# Patient Record
Sex: Male | Born: 1965 | Race: Black or African American | Hispanic: No | Marital: Single | Smoking: Current every day smoker
Health system: Southern US, Community
[De-identification: ages and names within clinical notes are randomized; demographics above are authoritative.]

## PROBLEM LIST (undated history)

## (undated) HISTORY — PX: OTHER SURGICAL HISTORY: SHX169

---

## 1999-10-10 ENCOUNTER — Emergency Department (HOSPITAL_COMMUNITY): Admission: EM | Admit: 1999-10-10 | Discharge: 1999-10-10 | Payer: Self-pay | Admitting: *Deleted

## 2000-10-07 ENCOUNTER — Emergency Department (HOSPITAL_COMMUNITY): Admission: EM | Admit: 2000-10-07 | Discharge: 2000-10-07 | Payer: Self-pay | Admitting: Emergency Medicine

## 2004-04-05 ENCOUNTER — Emergency Department (HOSPITAL_COMMUNITY): Admission: EM | Admit: 2004-04-05 | Discharge: 2004-04-06 | Payer: Self-pay | Admitting: Emergency Medicine

## 2004-04-15 ENCOUNTER — Emergency Department (HOSPITAL_COMMUNITY): Admission: EM | Admit: 2004-04-15 | Discharge: 2004-04-15 | Payer: Self-pay | Admitting: Emergency Medicine

## 2004-04-21 ENCOUNTER — Emergency Department (HOSPITAL_COMMUNITY): Admission: EM | Admit: 2004-04-21 | Discharge: 2004-04-21 | Payer: Self-pay | Admitting: Emergency Medicine

## 2004-05-23 ENCOUNTER — Emergency Department (HOSPITAL_COMMUNITY): Admission: EM | Admit: 2004-05-23 | Discharge: 2004-05-23 | Payer: Self-pay | Admitting: Emergency Medicine

## 2004-09-08 ENCOUNTER — Emergency Department (HOSPITAL_COMMUNITY): Admission: EM | Admit: 2004-09-08 | Discharge: 2004-09-09 | Payer: Self-pay | Admitting: Emergency Medicine

## 2004-09-14 ENCOUNTER — Emergency Department (HOSPITAL_COMMUNITY): Admission: EM | Admit: 2004-09-14 | Discharge: 2004-09-14 | Payer: Self-pay | Admitting: Emergency Medicine

## 2004-09-19 ENCOUNTER — Emergency Department (HOSPITAL_COMMUNITY): Admission: EM | Admit: 2004-09-19 | Discharge: 2004-09-19 | Payer: Self-pay | Admitting: Emergency Medicine

## 2008-04-06 ENCOUNTER — Emergency Department (HOSPITAL_COMMUNITY): Admission: EM | Admit: 2008-04-06 | Discharge: 2008-04-06 | Payer: Self-pay | Admitting: Emergency Medicine

## 2011-04-09 ENCOUNTER — Emergency Department (HOSPITAL_COMMUNITY): Payer: Self-pay

## 2011-04-09 ENCOUNTER — Emergency Department (HOSPITAL_COMMUNITY)
Admission: EM | Admit: 2011-04-09 | Discharge: 2011-04-10 | Disposition: A | Discharge status: Home / Self Care | Payer: Self-pay | Attending: Emergency Medicine | Admitting: Emergency Medicine

## 2011-04-09 DIAGNOSIS — R0789 Other chest pain: Secondary | ICD-10-CM | POA: Insufficient documentation

## 2011-04-09 DIAGNOSIS — R209 Unspecified disturbances of skin sensation: Secondary | ICD-10-CM | POA: Insufficient documentation

## 2011-04-09 DIAGNOSIS — I498 Other specified cardiac arrhythmias: Secondary | ICD-10-CM | POA: Insufficient documentation

## 2011-04-09 DIAGNOSIS — M79609 Pain in unspecified limb: Secondary | ICD-10-CM | POA: Insufficient documentation

## 2011-04-09 LAB — CBC
HCT: 48.9 % (ref 39.0–52.0)
Hemoglobin: 17.2 g/dL — ABNORMAL HIGH (ref 13.0–17.0)
MCH: 31.7 pg (ref 26.0–34.0)
MCHC: 35.2 g/dL (ref 30.0–36.0)
MCV: 90.2 fL (ref 78.0–100.0)
Platelets: 150 10*3/uL (ref 150–400)
RBC: 5.42 MIL/uL (ref 4.22–5.81)
RDW: 13.3 % (ref 11.5–15.5)
WBC: 6.2 10*3/uL (ref 4.0–10.5)

## 2011-04-09 LAB — BASIC METABOLIC PANEL
BUN: 18 mg/dL (ref 6–23)
CO2: 31 mEq/L (ref 19–32)
Calcium: 9.8 mg/dL (ref 8.4–10.5)
Chloride: 100 mEq/L (ref 96–112)
Creatinine, Ser: 1.17 mg/dL (ref 0.4–1.5)
GFR calc Af Amer: >60 mL/min (ref >60)
GFR calc non Af Amer: >60 mL/min (ref >60)
Glucose, Bld: 95 mg/dL (ref 70–99)
Potassium: 4.3 mEq/L (ref 3.5–5.1)
Sodium: 139 mEq/L (ref 135–145)

## 2011-04-09 LAB — DIFFERENTIAL
Basophils Absolute: 0 10*3/uL (ref 0.0–0.1)
Basophils Relative: 0 % (ref 0–1)
Eosinophils Absolute: 0.3 10*3/uL (ref 0.0–0.7)
Eosinophils Relative: 4 % (ref 0–5)
Lymphocytes Relative: 25 % (ref 12–46)
Lymphs Abs: 1.6 10*3/uL (ref 0.7–4.0)
Monocytes Absolute: 0.5 10*3/uL (ref 0.1–1.0)
Monocytes Relative: 9 % (ref 3–12)
Neutro Abs: 3.8 10*3/uL (ref 1.7–7.7)
Neutrophils Relative %: 62 % (ref 43–77)

## 2011-04-09 LAB — POCT CARDIAC MARKERS
CKMB, poc: <1 ng/mL — ABNORMAL LOW (ref 1.0–8.0)
Myoglobin, poc: 41.4 ng/mL (ref 12–200)
Troponin i, poc: <0.05 ng/mL (ref 0.00–0.09)

## 2012-09-22 ENCOUNTER — Emergency Department (HOSPITAL_COMMUNITY)
Admission: EM | Admit: 2012-09-22 | Discharge: 2012-09-23 | Disposition: A | Discharge status: Home / Self Care | Payer: Self-pay | Attending: Emergency Medicine | Admitting: Emergency Medicine

## 2012-09-22 ENCOUNTER — Encounter (HOSPITAL_COMMUNITY): Payer: Self-pay | Admitting: Emergency Medicine

## 2012-09-22 DIAGNOSIS — F17200 Nicotine dependence, unspecified, uncomplicated: Secondary | ICD-10-CM | POA: Insufficient documentation

## 2012-09-22 DIAGNOSIS — L03319 Cellulitis of trunk, unspecified: Secondary | ICD-10-CM | POA: Insufficient documentation

## 2012-09-22 DIAGNOSIS — L0291 Cutaneous abscess, unspecified: Secondary | ICD-10-CM

## 2012-09-22 DIAGNOSIS — L02219 Cutaneous abscess of trunk, unspecified: Secondary | ICD-10-CM | POA: Insufficient documentation

## 2012-09-22 NOTE — ED Notes (Signed)
Pt with abscess to chest x 1 month

## 2012-09-22 NOTE — ED Notes (Signed)
Called patient 3 times and NO ANSWER. 

## 2012-09-22 NOTE — ED Provider Notes (Signed)
History   This chart was scribed for Douglas Munch, MD by Gerlean Ren. This patient was seen in room TR09C/TR09C and the patient's care was started at 10:05 PM .   CSN: [id number]  Arrival date & time 09/22/12  1829   None     Chief Complaint  Patient presents with  . Abscess    (Consider location/radiation/quality/duration/timing/severity/associated sxs/prior treatment) The history is provided by the patient. No language interpreter was used.   Douglas Schroeder is a 46 y.o. male who presents to the Emergency Department complaining of an abscess over sternal chest for past 2 months that has been getting larger over past 2 weeks.  Pt denies associated confusion, dizziness, and fever.  History reviewed. No pertinent past medical history.  History reviewed. No pertinent past surgical history.  History reviewed. No pertinent family history.  History  Substance Use Topics  . Smoking status: Current Every Day Smoker  . Smokeless tobacco: Not on file  . Alcohol Use: Yes      Review of Systems  Constitutional:       Per HPI, otherwise negative  HENT:       Per HPI, otherwise negative  Eyes: Negative.   Respiratory:       Per HPI, otherwise negative  Cardiovascular:       Per HPI, otherwise negative  Gastrointestinal: Negative for vomiting.  Genitourinary: Negative.   Musculoskeletal:       Per HPI, otherwise negative  Skin: Negative.        Abscess.  Neurological: Negative for syncope.    Allergies  Citric acid  Home Medications   Current Outpatient Rx  Name  Route  Sig  Dispense  Refill  . IBUPROFEN 200 MG PO TABS   Oral   Take 400 mg by mouth every 6 (six) hours as needed. For pain           BP 131/74  Pulse 69  Temp 98.8 F (37.1 C) (Oral)  Resp 18  SpO2 99%  Physical Exam  Nursing note and vitals reviewed. Constitutional: He is oriented to person, place, and time. He appears well-developed. No distress.  HENT:  Head: Normocephalic and  atraumatic.  Eyes: Conjunctivae normal and EOM are normal.  Cardiovascular: Normal rate and regular rhythm.   Pulmonary/Chest: Effort normal. No stridor. No respiratory distress.  Abdominal: He exhibits no distension.  Musculoskeletal: He exhibits no edema.  Neurological: He is alert and oriented to person, place, and time.  Skin: Skin is warm and dry.       Fluctuant 3cm mass over center of chest.  Psychiatric: He has a normal mood and affect.    ED Course  INCISION AND DRAINAGE Date/Time: 09/22/2012 11:48 PM Performed by: Douglas Schroeder Authorized by: Douglas Schroeder Consent: Verbal consent obtained. Written consent not obtained. The procedure was performed in an emergent situation. Risks and benefits: risks, benefits and alternatives were discussed Consent given by: patient Patient identity confirmed: verbally with patient Time out: Immediately prior to procedure a "time out" was called to verify the correct patient, procedure, equipment, support staff and site/side marked as required. Type: abscess Body area: trunk Location details: chest Anesthesia: local infiltration Local anesthetic: lidocaine 1% with epinephrine Anesthetic total: 4 ml Patient sedated: no Scalpel size: 11 Incision type: single with marsupialization Complexity: simple Drainage: purulent Drainage amount: copious Wound treatment: wound left open Patient tolerance: Patient tolerated the procedure well with no immediate complications.   (including critical care time) DIAGNOSTIC STUDIES: Oxygen  Saturation is 99% on room air, normal by my interpretation.    COORDINATION OF CARE: 10:06PM- Patient informed of clinical course, understands medical decision-making process, and agrees with plan.  Discussed I&D and patient agreed.     Labs Reviewed - No data to display No results found.   No diagnosis found.    MDM  I personally performed the services described in this documentation, which was  scribed in my presence. The recorded information has been reviewed and considered.  The patient presents with concerns of ongoing chest lesion.  The patient is an abscess that has been building for years.  Absent fever, chills, other concerning complaints the patient's wound was incised, drained, with no complications.  She was advised that given the years long history of this, he will likely need definitive revision at some point, with concern of a recurrent cyst.    Douglas Munch, MD 09/22/12 2350

## 2013-02-23 ENCOUNTER — Emergency Department (HOSPITAL_COMMUNITY)
Admission: EM | Admit: 2013-02-23 | Discharge: 2013-02-23 | Disposition: A | Discharge status: Home / Self Care | Payer: Self-pay | Attending: Emergency Medicine | Admitting: Emergency Medicine

## 2013-02-23 ENCOUNTER — Encounter (HOSPITAL_COMMUNITY): Payer: Self-pay | Admitting: *Deleted

## 2013-02-23 DIAGNOSIS — L0231 Cutaneous abscess of buttock: Secondary | ICD-10-CM | POA: Insufficient documentation

## 2013-02-23 DIAGNOSIS — F17200 Nicotine dependence, unspecified, uncomplicated: Secondary | ICD-10-CM | POA: Insufficient documentation

## 2013-02-23 MED ORDER — OXYCODONE-ACETAMINOPHEN 5-325 MG PO TABS
2.0000 | ORAL_TABLET | Freq: Once | ORAL | Status: AC
  Administered 2013-02-23: 2 via ORAL
  Filled 2013-02-23: qty 2

## 2013-02-23 MED ORDER — HYDROCODONE-ACETAMINOPHEN 5-325 MG PO TABS: 1.0000 | ORAL_TABLET | Freq: Four times a day (QID) | ORAL | Status: DC | PRN

## 2013-02-23 NOTE — ED Provider Notes (Signed)
History     CSN: [id number]  Arrival date & time 02/23/13  0023   First MD Initiated Contact with Patient 02/23/13 0049      Chief Complaint  Patient presents with  . Abscess    (Consider location/radiation/quality/duration/timing/severity/associated sxs/prior treatment) HPI Comments: Patient with Hx multiple abscess now with abscess on L buttock for the past 4 days, not relief with compresses  Patient is a 47 y.o. male presenting with abscess. The history is provided by the patient.  Abscess Location:  Ano-genital Ano-genital abscess location:  L buttock Abscess quality: painful and redness   Abscess quality: not draining, no fluctuance and no induration   Red streaking: no   Duration:  4 weeks Progression:  Worsening Pain details:    Quality:  Pressure and throbbing   Severity:  Moderate   Timing:  Constant   Progression:  Worsening Chronicity:  Recurrent Associated symptoms: no fever     History reviewed. No pertinent past medical history.  History reviewed. No pertinent past surgical history.  History reviewed. No pertinent family history.  History  Substance Use Topics  . Smoking status: Current Every Day Smoker  . Smokeless tobacco: Not on file  . Alcohol Use: Yes      Review of Systems  Constitutional: Negative for fever and chills.  Gastrointestinal: Negative for diarrhea, constipation and rectal pain.  Skin: Positive for wound.  All other systems reviewed and are negative.    Allergies  Citric acid  Home Medications   Current Outpatient Rx  Name  Route  Sig  Dispense  Refill  . HYDROcodone-acetaminophen (NORCO/VICODIN) 5-325 MG per tablet   Oral   Take 1 tablet by mouth every 6 (six) hours as needed for pain.   17 tablet   0   . ibuprofen (ADVIL,MOTRIN) 200 MG tablet   Oral   Take 400 mg by mouth every 6 (six) hours as needed. For pain           BP 128/72  Pulse 93  Temp(Src) 98.3 F (36.8 C) (Oral)  Ht 6' (1.829 m)  Wt 187  lb (84.823 kg)  BMI 25.36 kg/m2  SpO2 96%  Physical Exam  Nursing note and vitals reviewed. Constitutional: He is oriented to person, place, and time. He appears well-developed and well-nourished.  HENT:  Head: Normocephalic.  Eyes: Pupils are equal, round, and reactive to light.  Neck: Normal range of motion.  Cardiovascular: Normal rate.   Pulmonary/Chest: Effort normal.  Genitourinary: Rectum normal.  No rectal involvement   Neurological: He is alert and oriented to person, place, and time.  Skin: Skin is warm and dry.  Abscess apx 4 cm long and 2 cm wide along the gultteal fold not involving the anus     ED Course  INCISION AND DRAINAGE Date/Time: 02/23/2013 1:29 AM Performed by: Arman Filter Authorized by: Arman Filter Consent: Verbal consent obtained. Risks and benefits: risks, benefits and alternatives were discussed Consent given by: patient Patient understanding: patient states understanding of the procedure being performed Patient identity confirmed: verbally with patient Type: abscess Body area: anogenital Location details: gluteal cleft Anesthesia: local infiltration Local anesthetic: lidocaine 1% with epinephrine Anesthetic total: 4 ml Patient sedated: no Scalpel size: 11 Needle gauge: 22 Incision type: single straight Complexity: simple Drainage: purulent Drainage amount: moderate Packing material: 1/4 in iodoform gauze Patient tolerance: Patient tolerated the procedure well with no immediate complications.   (including critical care time)  Labs Reviewed - No data to  display No results found.   1. Abscess of buttock, left       MDM   Incision and drainage preformed with copious drainage packed with iodoform gauze and dressed patient is to remove the packing in 2 days and allow to heal         Arman Filter, NP 02/23/13 0133

## 2013-02-23 NOTE — ED Notes (Signed)
Patient presents with boil on the inside of his left butt check

## 2013-02-23 NOTE — ED Notes (Signed)
Dressing applied to wound.  Discharge inst given  Voiced understanding

## 2013-02-23 NOTE — ED Provider Notes (Signed)
Medical screening examination/treatment/procedure(s) were performed by non-physician practitioner and as supervising physician I was immediately available for consultation/collaboration.  Flint Melter, MD 02/23/13 [phone]

## 2013-06-02 ENCOUNTER — Emergency Department (HOSPITAL_COMMUNITY)
Admission: EM | Admit: 2013-06-02 | Discharge: 2013-06-02 | Disposition: A | Discharge status: Home / Self Care | Payer: No Typology Code available for payment source | Attending: Emergency Medicine | Admitting: Emergency Medicine

## 2013-06-02 DIAGNOSIS — S43499A Other sprain of unspecified shoulder joint, initial encounter: Secondary | ICD-10-CM | POA: Insufficient documentation

## 2013-06-02 DIAGNOSIS — F17200 Nicotine dependence, unspecified, uncomplicated: Secondary | ICD-10-CM | POA: Insufficient documentation

## 2013-06-02 DIAGNOSIS — Y92410 Unspecified street and highway as the place of occurrence of the external cause: Secondary | ICD-10-CM | POA: Insufficient documentation

## 2013-06-02 DIAGNOSIS — IMO0002 Reserved for concepts with insufficient information to code with codable children: Secondary | ICD-10-CM | POA: Insufficient documentation

## 2013-06-02 DIAGNOSIS — Y9389 Activity, other specified: Secondary | ICD-10-CM | POA: Insufficient documentation

## 2013-06-02 DIAGNOSIS — T148XXA Other injury of unspecified body region, initial encounter: Secondary | ICD-10-CM

## 2013-06-02 MED ORDER — IBUPROFEN 800 MG PO TABS: 800.0000 mg | ORAL_TABLET | Freq: Three times a day (TID) | ORAL | Status: DC
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
## 2013-06-03 NOTE — ED Provider Notes (Signed)
Medical screening examination/treatment/procedure(s) were performed by non-physician practitioner and as supervising physician I was immediately available for consultation/collaboration.   Laray Anger, DO 06/03/13 1546

## 2013-07-06 ENCOUNTER — Emergency Department (HOSPITAL_COMMUNITY): Payer: Self-pay

## 2013-07-06 ENCOUNTER — Encounter (HOSPITAL_COMMUNITY): Payer: Self-pay | Admitting: *Deleted

## 2013-07-06 ENCOUNTER — Emergency Department (HOSPITAL_COMMUNITY)
Admission: EM | Admit: 2013-07-06 | Discharge: 2013-07-06 | Disposition: A | Discharge status: Home / Self Care | Payer: Self-pay | Attending: Emergency Medicine | Admitting: Emergency Medicine

## 2013-07-06 DIAGNOSIS — L0231 Cutaneous abscess of buttock: Secondary | ICD-10-CM | POA: Insufficient documentation

## 2013-07-06 DIAGNOSIS — F17200 Nicotine dependence, unspecified, uncomplicated: Secondary | ICD-10-CM | POA: Insufficient documentation

## 2013-07-06 LAB — CBC WITH DIFFERENTIAL/PLATELET
Basophils Absolute: 0 10*3/uL (ref 0.0–0.1)
Basophils Relative: 0 % (ref 0–1)
Eosinophils Absolute: 0.1 10*3/uL (ref 0.0–0.7)
Eosinophils Relative: 1 % (ref 0–5)
HCT: 49 % (ref 39.0–52.0)
Hemoglobin: 17.2 g/dL — ABNORMAL HIGH (ref 13.0–17.0)
Lymphocytes Relative: 11 % — ABNORMAL LOW (ref 12–46)
Lymphs Abs: 1.3 10*3/uL (ref 0.7–4.0)
MCH: 32.1 pg (ref 26.0–34.0)
MCHC: 35.1 g/dL (ref 30.0–36.0)
MCV: 91.4 fL (ref 78.0–100.0)
Monocytes Absolute: 1.2 10*3/uL — ABNORMAL HIGH (ref 0.1–1.0)
Monocytes Relative: 10 % (ref 3–12)
Neutro Abs: 9.5 10*3/uL — ABNORMAL HIGH (ref 1.7–7.7)
Neutrophils Relative %: 78 % — ABNORMAL HIGH (ref 43–77)
Platelets: 169 10*3/uL (ref 150–400)
RBC: 5.36 MIL/uL (ref 4.22–5.81)
RDW: 13.7 % (ref 11.5–15.5)
WBC: 12.1 10*3/uL — ABNORMAL HIGH (ref 4.0–10.5)

## 2013-07-06 LAB — BASIC METABOLIC PANEL
BUN: 15 mg/dL (ref 6–23)
CO2: 27 mEq/L (ref 19–32)
Calcium: 9.8 mg/dL (ref 8.4–10.5)
Chloride: 100 mEq/L (ref 96–112)
Creatinine, Ser: 1.19 mg/dL (ref 0.50–1.35)
GFR calc Af Amer: 81 mL/min — ABNORMAL LOW (ref >90)
GFR calc non Af Amer: 70 mL/min — ABNORMAL LOW (ref >90)
Glucose, Bld: 92 mg/dL (ref 70–99)
Potassium: 4.1 mEq/L (ref 3.5–5.1)
Sodium: 137 mEq/L (ref 135–145)

## 2013-07-06 LAB — URINALYSIS, ROUTINE W REFLEX MICROSCOPIC
Glucose, UA: NEGATIVE mg/dL
Hgb urine dipstick: NEGATIVE
Ketones, ur: 15 mg/dL — AB
Leukocytes, UA: NEGATIVE
Nitrite: NEGATIVE
Protein, ur: NEGATIVE mg/dL
Specific Gravity, Urine: 1.026 (ref 1.005–1.030)
Urobilinogen, UA: 1 mg/dL (ref 0.0–1.0)
pH: 6 (ref 5.0–8.0)

## 2013-07-06 MED ORDER — MORPHINE SULFATE 4 MG/ML IJ SOLN
4.0000 mg | Freq: Once | INTRAMUSCULAR | Status: AC
  Administered 2013-07-06: 4 mg via INTRAVENOUS
  Filled 2013-07-06: qty 1

## 2013-07-06 MED ORDER — HYDROCODONE-ACETAMINOPHEN 5-325 MG PO TABS: 1.0000 | ORAL_TABLET | ORAL | Status: DC | PRN

## 2013-07-06 MED ORDER — CEPHALEXIN 500 MG PO CAPS: 500.0000 mg | ORAL_CAPSULE | Freq: Four times a day (QID) | ORAL | Status: DC

## 2013-07-06 MED ORDER — IOHEXOL 300 MG/ML  SOLN
100.0000 mL | Freq: Once | INTRAMUSCULAR | Status: AC | PRN
  Administered 2013-07-06: 100 mL via INTRAVENOUS

## 2013-07-06 NOTE — ED Notes (Signed)
Pt is here with abscess to left buttock not draining

## 2013-07-06 NOTE — ED Notes (Signed)
Patient is resting comfortably in the bed. Vital signs stable. Patient denies pain at this time. Will continue to assess patient.

## 2013-07-06 NOTE — ED Provider Notes (Signed)
CSN: [id number]     Arrival date & time 07/06/13  0847 History     First MD Initiated Contact with Patient 07/06/13 [phone]     Chief Complaint  Patient presents with  . Abscess   (Consider location/radiation/quality/duration/timing/severity/associated sxs/prior Treatment) HPI Comments: Patient is a 47 yo M presenting to the ED for a painful abscess in buttock area that appeared over the last month. Pt rates his pain "11/10" aggravated with ambulating and palpation. No alleviating factors. Patient denies any fevers, drainage from area, diarrhea, constipation, bloody or dark stools, urinary symptoms, testicular or penile swelling. Patient states he has a history of recurrent abscesses and states he has had an abscess in the past that has required surgical draining. Denies fevers or chills.   Patient is a 47 y.o. male presenting with abscess.  Abscess Associated symptoms: no fever     History reviewed. No pertinent past medical history. No past surgical history on file. No family history on file. History  Substance Use Topics  . Smoking status: Current Every Day Smoker  . Smokeless tobacco: Not on file  . Alcohol Use: Yes    Review of Systems  Constitutional: Negative for fever and chills.  HENT: Negative.   Eyes: Negative.   Respiratory: Negative for shortness of breath.   Cardiovascular: Negative for chest pain.  Gastrointestinal: Negative.   Genitourinary: Negative.   Musculoskeletal: Negative.   Skin: Negative for pallor and rash.       Abscess  Neurological: Negative.     Allergies  Citric acid  Home Medications   Current Outpatient Rx  Name  Route  Sig  Dispense  Refill  . ibuprofen (ADVIL,MOTRIN) 800 MG tablet   Oral   Take 1 tablet (800 mg total) by mouth 3 (three) times daily.   21 tablet   0    BP 115/66  Pulse 76  Temp(Src) 97.7 F (36.5 C) (Oral)  Resp 14  Ht 6' (1.829 m)  Wt 190 lb (86.183 kg)  BMI 25.76 kg/m2  SpO2 96% Physical Exam    Constitutional: He is oriented to person, place, and time. He appears well-developed and well-nourished. No distress.  HENT:  Head: Normocephalic and atraumatic.  Eyes: Conjunctivae are normal.  Neck: Neck supple.  Cardiovascular: Normal rate, regular rhythm and normal heart sounds.   Pulmonary/Chest: Effort normal and breath sounds normal.  Abdominal: Soft. Bowel sounds are normal. There is no tenderness.  Genitourinary: Testes normal and penis normal. Rectal exam shows no external hemorrhoid, no internal hemorrhoid, no fissure, no mass, no tenderness and anal tone normal.  Neurological: He is alert and oriented to person, place, and time.  Skin: Skin is warm and dry. He is not diaphoretic.     3cm x 1 cm fluctuant erythematous tender abscess < 1 cm above rectum.   Psychiatric: He has a normal mood and affect.    ED Course   Procedures (including critical care time)  Labs Reviewed  CBC WITH DIFFERENTIAL - Abnormal; Notable for the following:    WBC 12.1 (*)    Hemoglobin 17.2 (*)    Neutrophils Relative % 78 (*)    Neutro Abs 9.5 (*)    Lymphocytes Relative 11 (*)    Monocytes Absolute 1.2 (*)    All other components within normal limits  BASIC METABOLIC PANEL - Abnormal; Notable for the following:    GFR calc non Af Amer 70 (*)    GFR calc Af Amer 81 (*)  All other components within normal limits  URINALYSIS, ROUTINE W REFLEX MICROSCOPIC - Abnormal; Notable for the following:    Bilirubin Urine SMALL (*)    Ketones, ur 15 (*)    All other components within normal limits   Ct Pelvis W Contrast  07/06/2013   *RADIOLOGY REPORT*  Clinical Data:  Abscess left buttock region not draining.  CT PELVIS WITH CONTRAST  Technique:  Multidetector CT imaging of the pelvis was performed using the standard protocol following the bolus administration of intravenous contrast.  Contrast: OMNIPAQUE IOHEXOL 300 MG/ML  SOLN  Comparison:   None.  Findings:  Extending from the posterior  perirectal region to the left of midline is a 4.8 x 2.8 x 1.6 cm abscess with overlying cellulitis type changes also suspected.  No discrete connection into the pelvis.  Decompressed urinary bladder unremarkable.  Slightly lobulated prostate gland and very mild prominence of the seminal vesicles.  Advanced atherosclerotic type changes of visualized arterial structures including ectatic common iliac arteries with right common iliac artery measuring up to 1.4 cm.  The  No bony abnormality noted.  IMPRESSION: Extending from the posterior perirectal region to the left of midline is a 4.8 x 2.8 x 1.6 cm abscess with overlying cellulitis type changes also suspected. No connection into the pelvis noted.  Advanced atherosclerotic type changes   Original Report Authenticated By: Lacy Duverney, M.D.   INCISION AND DRAINAGE Performed by: Francee Piccolo L Consent: Verbal consent obtained. Risks and benefits: risks, benefits and alternatives were discussed Type: abscess  Body area: anogenital along gluteal cleft (no rectal or pelvic invovlement per CT)  Anesthesia: local infiltration  Incision was made with a scalpel.  Local anesthetic: lidocaine 2% w/o epinephrine  Anesthetic total: 5 ml  Complexity: complex Blunt dissection to break up loculations  Drainage: purulent  Drainage amount: copious  Packing material: 1/4 in iodoform gauze  Patient tolerance: Patient tolerated the procedure well with no immediate complications.   1. Abscess, gluteal cleft     MDM  Pt afebrile, AAOx4, NAD, non-toxic appearing. Patient with skin abscess amenable to incision and drainage after CT pelvis obtained to r/o rectal/pelvic involvement based on close proximity to rectum.  I have reviewed nursing notes, vital signs, and all appropriate lab and imaging results for this patient. Wound recheck in 2 days. Encouraged home warm soaks and flushing.  Mild signs of cellulitis is surrounding skin noted on PE and on  CT scan.  Pain improved with pain medication administration and I&D. Will d/c to home with Keflex. Advised surgery follow up given recurrence of anogenital abscesses that in past have required surgical excision. Patient is agreeable to plan. Return precautions discussed. Patient is agreeable to plan. Patient d/w with Dr. Ranae Palms, agrees with plan. Patient is stable at time of discharge       Jeannetta Ellis, PA-C 07/06/13 1517

## 2013-07-07 NOTE — ED Provider Notes (Signed)
Medical screening examination/treatment/procedure(s) were conducted as a shared visit with non-physician practitioner(s) and myself.  I personally evaluated the patient during the encounter Pt with superficial intergluteal abscess with no apparent intrarectal extension. No constitutional symptoms. Abscess drained in ED. Return precautions given  Loren Racer, MD 07/07/13 1000

## 2013-09-04 ENCOUNTER — Encounter (HOSPITAL_COMMUNITY): Payer: Self-pay | Admitting: Emergency Medicine

## 2013-09-04 ENCOUNTER — Emergency Department (HOSPITAL_COMMUNITY)
Admission: EM | Admit: 2013-09-04 | Discharge: 2013-09-04 | Disposition: A | Discharge status: Home / Self Care | Payer: Self-pay | Attending: Emergency Medicine | Admitting: Emergency Medicine

## 2013-09-04 DIAGNOSIS — L02213 Cutaneous abscess of chest wall: Secondary | ICD-10-CM

## 2013-09-04 DIAGNOSIS — F17200 Nicotine dependence, unspecified, uncomplicated: Secondary | ICD-10-CM | POA: Insufficient documentation

## 2013-09-04 DIAGNOSIS — L02219 Cutaneous abscess of trunk, unspecified: Secondary | ICD-10-CM | POA: Insufficient documentation

## 2013-09-04 MED ORDER — SULFAMETHOXAZOLE-TRIMETHOPRIM 800-160 MG PO TABS: 1.0000 | ORAL_TABLET | Freq: Two times a day (BID) | ORAL | Status: DC

## 2013-09-04 MED ORDER — CEPHALEXIN 500 MG PO CAPS: 500.0000 mg | ORAL_CAPSULE | Freq: Two times a day (BID) | ORAL | Status: DC

## 2013-09-04 NOTE — ED Provider Notes (Signed)
CSN: [id number]     Arrival date & time 09/04/13  1308 History   First MD Initiated Contact with Patient 09/04/13 [phone]     Chief Complaint  Patient presents with  . Abscess    HPI  KELDON LASSEN is a 47 y.o. male with no PMH who presents to the ED for evaluation of an abscess.  History was provided by the patient.  Patient states that he has had a mass on his chest for several years (approximately 3-4).  He states that his mass intermittently develops into an abscess and requires drainage.  He states that he developed worsening swelling to the mass over the past month. He denies any drainage or fevers. He has not been on antibiotics since August for an abscess to his buttocks. Patient has a history of multiple abscesses and is unsure why he heaped getting them. He denies any history of diabetes.  He states he has seen a surgeon in the past for his other abscesses but cannot comment on why he did not follow-up with this abscess in the past.  He denies any trauma to the chest or previous chest wound.  He otherwise is doing well with no change in appetite/activity, rhinorrhea, sore throat, cough, SOB, chest pain, abdominal pain, nausea, emesis, dysuria, constipation/diarrhea, leg edema, weakness, or headache.     History reviewed. No pertinent past medical history. History reviewed. No pertinent past surgical history. No family history on file. History  Substance Use Topics  . Smoking status: Current Every Day Smoker -- 1.00 packs/day    Types: Cigarettes  . Smokeless tobacco: Not on file  . Alcohol Use: Yes    Review of Systems  Constitutional: Negative for fever, activity change, appetite change and fatigue.  HENT: Negative for rhinorrhea.   Eyes: Negative for visual disturbance.  Respiratory: Negative for cough, shortness of breath and wheezing.   Cardiovascular: Positive for chest pain (abscess (localized)). Negative for leg swelling.  Gastrointestinal: Negative for nausea, vomiting,  abdominal pain and diarrhea.  Genitourinary: Negative for dysuria.  Musculoskeletal: Negative for back pain and gait problem.  Skin: Positive for color change (abscess) and wound (abscess). Negative for rash.  Neurological: Negative for weakness and headaches.    Allergies  Citric acid  Home Medications  No current outpatient prescriptions on file. BP 120/76  Pulse 66  Temp(Src) 97.9 F (36.6 C) (Oral)  Resp 16  Ht 6' (1.829 m)  Wt 190 lb (86.183 kg)  BMI 25.76 kg/m2  SpO2 100%  Filed Vitals:   09/04/13 0833 09/04/13 0836 09/04/13 1041  BP: 120/76  131/86  Pulse: 66  61  Temp: 97.9 F (36.6 C)  97.1 F (36.2 C)  TempSrc: Oral  Oral  Resp: 16  18  Height:  6' (1.829 m)   Weight:  190 lb (86.183 kg)   SpO2: 100%  99%    Physical Exam  Nursing note and vitals reviewed. Constitutional: He is oriented to person, place, and time. He appears well-developed. No distress.  HENT:  Head: Normocephalic and atraumatic.  Right Ear: External ear normal.  Left Ear: External ear normal.  Nose: Nose normal.  Mouth/Throat: Oropharynx is clear and moist.  Eyes: Conjunctivae are normal. Pupils are equal, round, and reactive to light. Right eye exhibits no discharge. Left eye exhibits no discharge.  Neck: Normal range of motion. Neck supple.  Cardiovascular: Normal rate, regular rhythm, normal heart sounds and intact distal pulses.  Exam reveals no gallop and no friction  rub.   No murmur heard. Pulmonary/Chest: Effort normal and breath sounds normal. No respiratory distress. He has no wheezes. He has no rales. He exhibits tenderness (abscess).    Abdominal: Soft. Bowel sounds are normal. He exhibits no distension. There is no tenderness.  Musculoskeletal: Normal range of motion. He exhibits no edema and no tenderness.  Neurological: He is alert and oriented to person, place, and time.  Skin: Skin is warm and dry. He is not diaphoretic.  3 cm x 3 cm circular fluctuant abscess with a  5 mm border or surrounding erythema to the mid-sternal region of the chest.  No open wounds or drainage.  Area is tender to palpation.      ED Course  INCISION AND DRAINAGE Date/Time: 09/04/2013 10:30 AM Performed by: Coral Ceo K Authorized by: Jillyn Ledger Consent: Verbal consent obtained. Consent given by: patient Patient identity confirmed: verbally with patient Type: abscess Body area: trunk Location details: chest Anesthesia: local infiltration Local anesthetic: lidocaine 2% without epinephrine Anesthetic total: 3 ml Patient sedated: no Scalpel size: 11 Complexity: simple Drainage: purulent Drainage amount: moderate Wound treatment: wound left open Patient tolerance: Patient tolerated the procedure well with no immediate complications. Comments: Patient refused packing    (including critical care time) Labs Review Labs Reviewed - No data to display Imaging Review No results found.  EKG Interpretation   None       MDM   1. Chest wall abscess     NIKALAS BRAMEL is a 47 y.o. male with no PMH who presents to the ED for evaluation of an abscess.  Wound culture will be done due to recurrent abscesses.         Patient was evaluated in the ED for an abscess which was drained in the ED.  Wound culture sent.  Patient was prescribed Keflex and Bactrim for outpatient management.  He was non-toxic, afebrile, and remained in no acute distress throughout his ED visit.  Patient was given referral to general surgery.  He was encouraged to keep the area clean dry and cleans daily with soap and water.  Patient was instructed to return to the ED if they experience any spreading redness/swelling, fever, severe pain, or other concerns.  Patient was in agreement with discharge and plan.     Final impressions: 1. Abscess, chest wall     Greer Ee Terisha Losasso PA-C         Jillyn Ledger, [place] 09/04/13 1228

## 2013-09-04 NOTE — ED Provider Notes (Signed)
Medical screening examination/treatment/procedure(s) were performed by non-physician practitioner and as supervising physician I was immediately available for consultation/collaboration.   Glynn Octave, MD 09/04/13 [phone]

## 2013-09-04 NOTE — ED Notes (Signed)
Abscess in middle chest.  Recurrent.

## 2013-09-04 NOTE — ED Notes (Signed)
Patient states has had "knot come up on my chest again".   Patient states "I keep getting this boil on my chest.  They have cut it open several times".   Patient states needs to "be opened up again".

## 2013-09-07 LAB — CULTURE, ROUTINE-ABSCESS

## 2014-05-15 ENCOUNTER — Encounter (HOSPITAL_COMMUNITY): Payer: Self-pay | Admitting: Emergency Medicine

## 2014-05-15 ENCOUNTER — Emergency Department (HOSPITAL_COMMUNITY)
Admission: EM | Admit: 2014-05-15 | Discharge: 2014-05-15 | Disposition: A | Discharge status: Home / Self Care | Payer: No Typology Code available for payment source | Attending: Emergency Medicine | Admitting: Emergency Medicine

## 2014-05-15 DIAGNOSIS — M5410 Radiculopathy, site unspecified: Secondary | ICD-10-CM

## 2014-05-15 DIAGNOSIS — M5412 Radiculopathy, cervical region: Secondary | ICD-10-CM | POA: Insufficient documentation

## 2014-05-15 DIAGNOSIS — M7520 Bicipital tendinitis, unspecified shoulder: Secondary | ICD-10-CM | POA: Insufficient documentation

## 2014-05-15 DIAGNOSIS — F17200 Nicotine dependence, unspecified, uncomplicated: Secondary | ICD-10-CM | POA: Insufficient documentation

## 2014-05-15 DIAGNOSIS — M7522 Bicipital tendinitis, left shoulder: Secondary | ICD-10-CM

## 2014-05-15 LAB — I-STAT CHEM 8, ED
BUN: 7 mg/dL (ref 6–23)
Calcium, Ion: 1.21 mmol/L (ref 1.12–1.23)
Chloride: 101 meq/L (ref 96–112)
Creatinine, Ser: 1.2 mg/dL (ref 0.50–1.35)
Glucose, Bld: 76 mg/dL (ref 70–99)
HCT: 51 % (ref 39.0–52.0)
Hemoglobin: 17.3 g/dL — ABNORMAL HIGH (ref 13.0–17.0)
Potassium: 4 meq/L (ref 3.7–5.3)
Sodium: 141 meq/L (ref 137–147)
TCO2: 25 mmol/L (ref 0–100)

## 2014-05-15 MED ORDER — TRAMADOL HCL 50 MG PO TABS: 50.0000 mg | ORAL_TABLET | Freq: Four times a day (QID) | ORAL | Status: DC | PRN

## 2014-05-15 MED ORDER — PREDNISONE 10 MG PO TABS: ORAL_TABLET | ORAL | Status: DC

## 2014-05-15 NOTE — ED Notes (Signed)
Pt c/o left shoulder pain and left arm numbness x 1 week. Pt denies recent injury.

## 2014-05-15 NOTE — ED Provider Notes (Signed)
CSN: [id number]     Arrival date & time 05/15/14  1246 History   First MD Initiated Contact with Patient 05/15/14 1522     Chief Complaint  Patient presents with  . Shoulder Pain  . Numbness   HPI Comments: Patient complains of numbness and burning to all five fingers which originates in the shoulder.  Patient is a 48 y.o. male presenting with shoulder pain. The history is provided by the patient. No language interpreter was used.  Shoulder Pain This is a new problem. The current episode started in the past 7 days. The problem occurs constantly. The problem has been gradually worsening. Associated symptoms include numbness. Pertinent negatives include no abdominal pain, anorexia, arthralgias, change in bowel habit, chest pain, chills, congestion, coughing, diaphoresis, fatigue, fever, headaches, joint swelling, myalgias, nausea, neck pain, rash, sore throat, swollen glands, urinary symptoms, vertigo, visual change, vomiting or weakness. Exacerbated by: with rest. He has tried acetaminophen for the symptoms. The treatment provided no relief.    History reviewed. No pertinent past medical history. Past Surgical History  Procedure Laterality Date  . Other surgical history  Facial   No family history on file. History  Substance Use Topics  . Smoking status: Current Every Day Smoker -- 1.00 packs/day    Types: Cigarettes  . Smokeless tobacco: Not on file  . Alcohol Use: Yes    Review of Systems  Constitutional: Negative for fever, chills, diaphoresis and fatigue.  HENT: Negative for congestion and sore throat.   Respiratory: Negative for cough.   Cardiovascular: Negative for chest pain and palpitations.  Gastrointestinal: Negative for nausea, vomiting, abdominal pain, anorexia and change in bowel habit.  Musculoskeletal: Negative for arthralgias, joint swelling, myalgias and neck pain.  Skin: Negative for rash.  Neurological: Positive for numbness. Negative for dizziness, vertigo,  tremors, syncope, facial asymmetry, speech difficulty, weakness and headaches.  All other systems reviewed and are negative.     Allergies  Citric acid  Home Medications   Prior to Admission medications   Medication Sig Start Date End Date Taking? Authorizing Provider  predniSONE (DELTASONE) 10 MG tablet Take 6 tablets day 1 Take 5 tablets day 2 Take 4 tablets day 3 Take 3 tablets day 4 Take 2 tablets day 5 Take 1 tablet day 6 05/15/14   Aily Tzeng A Forucci, PA-C  traMADol (ULTRAM) 50 MG tablet Take 1 tablet (50 mg total) by mouth every 6 (six) hours as needed. 05/15/14   Rayland Hamed A Forucci, PA-C   BP 128/75  Pulse 78  Temp(Src) 97.9 F (36.6 C) (Oral)  Resp 18  Ht 6' (1.829 m)  Wt 190 lb (86.183 kg)  BMI 25.76 kg/m2  SpO2 98% Physical Exam  Nursing note and vitals reviewed. Constitutional: He is oriented to person, place, and time. He appears well-developed and well-nourished. No distress.  HENT:  Head: Normocephalic and atraumatic.  Mouth/Throat: Oropharynx is clear and moist. No oropharyngeal exudate.  Eyes: Conjunctivae are normal. No scleral icterus.  Neck: Normal range of motion and full passive range of motion without pain. Neck supple. No spinous process tenderness and no muscular tenderness present. No edema and normal range of motion present. No thyromegaly present.  Cardiovascular: Normal rate, regular rhythm, normal heart sounds and intact distal pulses.  Exam reveals no gallop and no friction rub.   No murmur heard. Pulmonary/Chest: Effort normal and breath sounds normal. No respiratory distress. He has no wheezes. He has no rales. He exhibits no tenderness.  Musculoskeletal: Normal range  of motion.       Left shoulder: He exhibits normal range of motion.  No obvious effusion, deformity, or ecchymosis. Patient has tenderness to palpation over the [facility], and biceps tendon.  Full active ROM demonstrated at this time.  5/5 forward flexion, 5/5 elbow flexion and  extension, and 4/5 right grip strength.  Patient complains of mild sensory deficit over the right arm.  Positive speeds test.  Negative empty can.  Negative belly press test, negative lift off test.      Lymphadenopathy:    He has no cervical adenopathy.  Neurological: He is alert and oriented to person, place, and time. No cranial nerve deficit. Coordination normal.  Skin: Skin is warm and dry. He is not diaphoretic.  Psychiatric: He has a normal mood and affect. His behavior is normal. Judgment and thought content normal.    ED Course  Procedures (including critical care time) Labs Review Labs Reviewed  I-STAT CHEM 8, ED - Abnormal; Notable for the following:    Hemoglobin 17.3 (*)    All other components within normal limits    Imaging Review No results found.   EKG Interpretation None      MDM   Final diagnoses:  Biceps tendonitis, left  Radiculopathy affecting upper extremity   Patient is a 48 y.o. Male who presents to the ED for left arm numbness and pain.  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  Patient had mild sensory deficits on exam, but exhibited good strength.  There was no bony tenderness so no imaging studies were performed at this time.  I have prescribed prednisone for radicular symptoms and have also given trammadol for pain management. I have counseled the patient on discontinuing smoking.  Patient is stable for discharge at this time.  He was given strict return precautions of sudden onset severe headache, and headache with neck pain and stiffness.  He states understanding and will follow-up with a PCP of his choosing from the resource list.     Clydie Braunourtney A Forucci, PA-C 05/15/14 1907

## 2014-05-15 NOTE — ED Notes (Signed)
Called to Room x1 

## 2014-05-15 NOTE — Discharge Instructions (Signed)
Cervical Radiculopathy Cervical radiculopathy happens when a nerve in the neck is pinched or bruised by a slipped (herniated) disk or by arthritic changes in the bones of the cervical spine. This can occur due to an injury or as part of the normal aging process. Pressure on the cervical nerves can cause pain or numbness that runs from your neck all the way down into your arm and fingers. CAUSES  There are many possible causes, including:  Injury.  Muscle tightness in the neck from overuse.  Swollen, painful joints (arthritis).  Breakdown or degeneration in the bones and joints of the spine (spondylosis) due to aging.  Bone spurs that may develop near the cervical nerves. SYMPTOMS  Symptoms include pain, weakness, or numbness in the affected arm and hand. Pain can be severe or irritating. Symptoms may be worse when extending or turning the neck. DIAGNOSIS  Your caregiver will ask about your symptoms and do a physical exam. He or she may test your strength and reflexes. X-rays, CT scans, and MRI scans may be needed in cases of injury or if the symptoms do not go away after a period of time. Electromyography (EMG) or nerve conduction testing may be done to study how your nerves and muscles are working. TREATMENT  Your caregiver may recommend certain exercises to help relieve your symptoms. Cervical radiculopathy can, and often does, get better with time and treatment. If your problems continue, treatment options may include:  Wearing a soft collar for short periods of time.  Physical therapy to strengthen the neck muscles.  Medicines, such as nonsteroidal anti-inflammatory drugs (NSAIDs), oral corticosteroids, or spinal injections.  Surgery. Different types of surgery may be done depending on the cause of your problems. HOME CARE INSTRUCTIONS   Put ice on the affected area.  Put ice in a plastic bag.  Place a towel between your skin and the bag.  Leave the ice on for 15-20 minutes,  03-04 times a day or as directed by your caregiver.  If ice does not help, you can try using heat. Take a warm shower or bath, or use a hot water bottle as directed by your caregiver.  You may try a gentle neck and shoulder massage.  Use a flat pillow when you sleep.  Only take over-the-counter or prescription medicines for pain, discomfort, or fever as directed by your caregiver.  If physical therapy was prescribed, follow your caregiver's directions.  If a soft collar was prescribed, use it as directed. SEEK IMMEDIATE MEDICAL CARE IF:   Your pain gets much worse and cannot be controlled with medicines.  You have weakness or numbness in your hand, arm, face, or leg.  You have a high fever or a stiff, rigid neck.  You lose bowel or bladder control (incontinence).  You have trouble with walking, balance, or speaking. MAKE SURE YOU:   Understand these instructions.  Will watch your condition.  Will get help right away if you are not doing well or get worse. Document Released: 07/31/2001 Document Revised: 01/28/2012 Document Reviewed: 06/19/2011 [facility] Patient Information 2015 [place], [place]. This information is not intended to replace advice given to you by your health care provider. Make sure you discuss any questions you have with your health care provider.   Bicipital Tendonitis Bicipital tendonitis refers to redness, soreness, and swelling (inflammation) or irritation of the bicep tendon. The biceps muscle is located between the elbow and shoulder of the inner arm. The tendon heads, similar to pieces of rope, connect  the bicep muscle to the shoulder socket. They are called short head and long head tendons. When tendonitis occurs, the long head tendon is inflamed and swollen, and may be thickened or partially torn.  Bicipital tendonitis can occur with other problems as well, such as arthritis in the shoulder or acromioclavicular joints, tears in the tendons, or other rotator  cuff problems.  CAUSES  Overuse of of the arms for overhead activities is the major cause of tendonitis. Many athletes, such as swimmers, baseball players, and tennis players are prone to bicipital tendonitis. Jobs that require manual labor or routine chores, especially chores involving overhead activities can result in overuse and tendonitis. SYMPTOMS Symptoms may include:  Pain in and around the front of the shoulder. Pain may be worse with overhead motion.  Pain or aching that radiates down the arm.  Clicking or shifting sensations in the shoulder. DIAGNOSIS Your caregiver may perform the following:  Physical exam and tests of the biceps and shoulder to observe range of motion, strength, and stability.  X-rays or magnetic resonance imaging (MRI) to confirm the diagnosis. In most common cases, these tests are not necessary. Since other problems may exist in the shoulder or rotator cuff, additional tests may be recommended. TREATMENT Treatment may include the following:  Medications  Your caregiver may prescribe over-the-counter pain relievers.  Steroid injections, such as cortisone, may be recommended. These may help to reduce inflammation and pain.  Physical Therapy - Your caregiver may recommend gentle exercises with the arm. These can help restore strength and range of motion. They may be done at home or with a physical therapist's supervision and input.  Surgery - Arthroscopic or open surgery sometimes is necessary. Surgery may include:  Reattachment or repair of the tendon at the shoulder socket.  Removal of the damaged section of the tendon.  Anchoring the tendon to a different area of the shoulder (tenodesis). HOME CARE INSTRUCTIONS   Avoid overhead motion of the affected arm or any other motion that causes pain.  Take medication for pain as directed. Do not take these for more than 3 weeks, unless directed to do so by your caregiver.  Ice the affected area for 20  minutes at a time, 3-4 times per day. Place a towel on the skin over the painful area and the ice or cold pack over the towel. Do not place ice directly on the skin.  Perform gentle exercises at home as directed. These will increase strength and flexibility. PREVENTION  Modify your activities as much as possible to protect your arm. A physical therapist or sports medicine physician can help you understand options for safe motion.  Avoid repetitive overhead pulling, lifting, reaching, and throwing until your caregiver tells you it is ok to resume these activities. SEEK MEDICAL CARE IF:  Your pain worsens.  You have difficulty moving the affected arm.  You have trouble performing any of the self-care instructions. MAKE SURE YOU:   Understand these instructions.  Will watch your condition.  Will get help right away if you are not doing well or get worse. Document Released: 12/08/2010 Document Revised: 01/28/2012 Document Reviewed: 12/08/2010 [facility] Patient Information 2015 [place], [place]. This information is not intended to replace advice given to you by your health care provider. Make sure you discuss any questions you have with your health care provider.   Emergency Department Resource Guide 1) Find a Doctor and Pay Out of Pocket Although you won't have to find out who is covered by  your insurance plan, it is a good idea to ask around and get recommendations. You will then need to call the office and see if the doctor you have chosen will accept you as a new patient and what types of options they offer for patients who are self-pay. Some doctors offer discounts or will set up payment plans for their patients who do not have insurance, but you will need to ask so you aren't surprised when you get to your appointment.  2) Contact Your Local Health Department Not all health departments have doctors that can see patients for sick visits, but many do, so it is worth a call to see if yours  does. If you don't know where your local health department is, you can check in your phone book. The CDC also has a tool to help you locate your state's health department, and many state websites also have listings of all of their local health departments.  3) Find a Walk-in Clinic If your illness is not likely to be very severe or complicated, you may want to try a walk in clinic. These are popping up all over the country in pharmacies, drugstores, and shopping centers. They're usually staffed by nurse practitioners or physician assistants that have been trained to treat common illnesses and complaints. They're usually fairly quick and inexpensive. However, if you have serious medical issues or chronic medical problems, these are probably not your best option.  No Primary Care Doctor: - Call Health Connect at  [phone] - they can help you locate a primary care doctor that  accepts your insurance, provides certain services, etc. - Physician Referral Service- [phone]  Chronic Pain Problems: Organization         Address  Phone   Notes  [facility]  [phone] Patients need to be referred by their primary care doctor.   Medication Assistance: Organization         Address  Phone   Notes  [facility] Medication [facility] [address] [place], [place] [address] [phone]) [phone] --Must be a resident of [facility] [place] -- Must have NO insurance coverage whatsoever (no Medicaid/ Medicare, etc.) -- The pt. MUST have a primary care doctor that directs their care regularly and follows them in the community   MedAssist  [phone]   [employer]  [phone]    Agencies that provide inexpensive medical care: Organization         Address  Phone   Notes  [facility]  [phone]   [facility]    [phone]) [phone]   [facility] [address] [place], [place] [address] [phone]) [phone]    [facility] [address], [place] [phone]   Planned Parenthood    [phone]   [facility]    [phone]   [facility]  [address], [place] Phone:  [phone], Fax:  [phone] Hours of Operation:  9 am - 6 pm, M-F.  Also accepts Medicaid/Medicare and self-pay.  [facility]  [address], [place] Phone: [phone], Fax: [phone]. Hours of Operation:  8:30 am - 5:30 pm, M-F.  Also accepts Medicaid and self-pay.  [facility] [place] [address], [place] Phone: [phone]) [phone]   [facility] [address], [place], [place] [phone], Ext. [phone] Mondays &  Thursdays: 7-9 AM.  First 15 patients are seen on a first come, first serve basis.    Medicaid-accepting [facility] Providers:  Organization         Address  Phone   Notes  [facility] [address], [place] [phone] Also accepts self-pay patients.  [facility] [address] [place], [place]  [phone]) [phone]   [facility] [address], [place] [phone]) [phone]   [facility] [address] [place] [address], [place] [phone]) [phone]   Renaye RakersVeita Bland [address], [place]   [phone]) [phone] Only accepts [place] Access [place] patients after they have their name applied to their card.   Self-Pay (no insurance) in [facility] [place]:  Organization         Address  Phone   Notes  Sickle Cell Patients, [facility] [address], [place] [phone]   [facility] [address], [place] [phone]) [phone]   [facility] [place]  [address], [place] [phone]   [facility]/Dr. Osei-Bonsu  [address] [place] [address], [place] or [id number], [place] [phone]  [phone] Phone number for both [place] and [place] locations is the same.  [facility] [address], [place] [phone]) [phone]   [facility] [address] [place] [address], [place] or [address] [phone]   [facility] [address], [place] [phone]) [phone], phone; [phone](336) [phone], fax Sees patients 1st and 3rd Saturday of every month.  Must not qualify for public or private insurance (i.e. Medicaid, Medicare, [place] Health Choice, Veterans' Benefits)  Household income should be no more than 200% of the poverty level The clinic cannot treat you if you are pregnant or think you are pregnant  Sexually transmitted diseases are not treated at the clinic.    Dental Care: Organization         Address  Phone  Notes  [facility] of [facility] [address], [place] [phone]) [phone] Accepts children up to age 50 who are enrolled in [place] or [place] Health Choice; pregnant women with a Medicaid card; and children who have applied for Medicaid or [place] Health Choice, but were declined, whose parents can pay a reduced fee at time of service.  [facility] of [facility] [place]  [address], [place] [phone] Accepts children up to age 50 who are enrolled in [place] or [place] Health Choice; pregnant women with a Medicaid card; and children who have applied for Medicaid or [place] Health Choice, but were declined, whose parents can pay a reduced fee at time of service.  [facility]  [address], [place] [phone] Patients are seen by appointment only. Walk-ins are not accepted. [facility] will see patients 50 years of age and older. Monday - Tuesday (8am-5pm) Most Wednesdays (8:30-5pm) $30 per visit, cash only  [facility]  [address], [facility] [phone] Patients are seen by  appointment only. Walk-ins are not accepted. [facility] will see patients 50 years of age and older. One Wednesday Evening (Monthly: Volunteer Based).  $30 per visit, cash only  [employer] [facility] [facility]  [phone] for adults; Children under  age 70, call Graduate Pediatric Dentistry at [phone]. Children aged 73-14, please call [phone] to request a pediatric application.  Dental services are provided in all areas of dental care including fillings, crowns and bridges, complete and partial dentures, implants, gum treatment, root canals, and extractions. Preventive care is also provided. Treatment is provided to both adults and children. Patients are selected via a lottery and there is often a waiting list.   [facility] [address] [place]  [phone] [email or web address]   [facility] [address] [place], [place] [phone], Ext. [phone] Second and Fourth Thursday of each month, opens at 6:30 AM; Clinic ends at 9 AM.  Patients are seen on a first-come first-served basis, and a limited number are seen during each clinic.   [facility]  [address] Ether Griffins [place], [place] [phone]   Eligibility Requirements You must have lived in [place], [place], or [place] counties for at least the last three months.   You cannot be eligible for state or federal sponsored [place], including [employer], [place], or [employer].   You generally cannot be eligible for healthcare insurance through your employer.    How to apply: Eligibility screenings are held every Tuesday and Wednesday afternoon from 1:00 pm until 4:00 pm. You do not need an appointment for the interview!  [facility] [address] [place], [place] [phone]   [facility] Health Department  [phone]   [facility] Health Department  [phone]   [facility] Health Department  [phone]     Behavioral Health Resources in the Community: Intensive Outpatient Programs Organization         Address  Phone  Notes  [facility] [address] [place], [place] [phone]   [facility] [address], [place], [place] [phone]   [facility] [address], [place], [place]  [phone]   [facility] Mental Health [address]  [place], [place] [phone] or [phone]   Substance Abuse Resources Organization         Address  Phone  Notes  Al[facility] Drug Services  [phone]   [facility]  [phone]   [facility]  [phone]   Floydene Flock  [phone]   Residential & Outpatient Substance Abuse Program  [phone]   Psychological Services Organization         Address  Phone  Notes  [facility]  [phone]   [facility]  [phone]   [facility] [address], [place] [phone] or [phone]    Mobile Crisis Teams Organization         Address  Phone  Notes  [facility], Mobile Crisis Care Unit  [phone]   [facility]  [address] [place], [place] [phone]   Doristine Locks [address] [place] [phone]    Self-Help/Support Groups Organization         Address  Phone             Notes  [facility] of [place] - variety of support groups  [phone]- I7437963 Call for more information  Narcotics Anonymous (NA), [facility] [address], [employer] [place]  2 meetings at this location   Chief Executive Officer  Notes  [facility] [phone] [address]  Lynne [place]  [phone]   [facility]  [address], [place] [address], [place], [place] [phone]   [facility] [address] [place], [place] [phone] Admissions: 8am-3pm M-F  Incentives  [facility]stance [facility] Treatment Center [address],    [place], [place] [phone]   [facility] [address] [place], [place], [place] [phone]   [facility] [address]  [place], [place] [phone]   [facility] - Intensive Outpatient [address], [place], [place] [phone]   [facility] ([facility] [address]  [place], [place] [phone] or [phone]   [facility] ([facility]) [address] [place], [place] [phone] Accepts Medicaid  [facility] [address],  [place] [phone] Substance Abuse/Addiction Treatment   [facility] Organization         Address  Phone  Notes  [facility]  [phone]   Angie Fava, PhD [address] Ervin Knack [place], [place]   [phone] or [phone]   [facility]al   [address] [place], [place] [phone]   [facility] [address], [place], [place] [phone] Insurance/Medicaid/sponsorship through [facility] and Families [address]                                    [place], [place] [phone] Therapy/tele-psych/case  [facility] [address], [place] [phone]    Dr. Lolly Mustache  [phone]   [facility] [place]  [employer] [facility] Dept. 1) [address] [place] 2) [address], [place] 3)  [address], [place] [phone]  [phone]   [facility] Child Abuse Hotline 865-028-4316 or [phone] (After Hours)

## 2014-05-16 NOTE — ED Provider Notes (Signed)
Medical screening examination/treatment/procedure(s) were performed by non-physician practitioner and as supervising physician I was immediately available for consultation/collaboration.   EKG Interpretation None       Stephen Kohut, MD 05/16/14 1747 

## 2015-01-31 ENCOUNTER — Emergency Department (HOSPITAL_COMMUNITY): Payer: Self-pay

## 2015-01-31 ENCOUNTER — Emergency Department (HOSPITAL_COMMUNITY)
Admission: EM | Admit: 2015-01-31 | Discharge: 2015-01-31 | Disposition: A | Discharge status: Home / Self Care | Payer: Self-pay | Attending: Emergency Medicine | Admitting: Emergency Medicine

## 2015-01-31 ENCOUNTER — Encounter (HOSPITAL_COMMUNITY): Payer: Self-pay | Admitting: *Deleted

## 2015-01-31 DIAGNOSIS — Y9389 Activity, other specified: Secondary | ICD-10-CM | POA: Insufficient documentation

## 2015-01-31 DIAGNOSIS — Z720 Tobacco use: Secondary | ICD-10-CM | POA: Insufficient documentation

## 2015-01-31 DIAGNOSIS — S6992XA Unspecified injury of left wrist, hand and finger(s), initial encounter: Secondary | ICD-10-CM | POA: Insufficient documentation

## 2015-01-31 DIAGNOSIS — S4992XA Unspecified injury of left shoulder and upper arm, initial encounter: Secondary | ICD-10-CM | POA: Insufficient documentation

## 2015-01-31 DIAGNOSIS — Y92524 Gas station as the place of occurrence of the external cause: Secondary | ICD-10-CM | POA: Insufficient documentation

## 2015-01-31 DIAGNOSIS — Y998 Other external cause status: Secondary | ICD-10-CM | POA: Insufficient documentation

## 2015-01-31 DIAGNOSIS — M79602 Pain in left arm: Secondary | ICD-10-CM

## 2015-01-31 MED ORDER — IBUPROFEN 800 MG PO TABS
800.0000 mg | ORAL_TABLET | Freq: Once | ORAL | Status: AC
  Administered 2015-01-31: 800 mg via ORAL
  Filled 2015-01-31: qty 1

## 2015-01-31 MED ORDER — IBUPROFEN 800 MG PO TABS: 800.0000 mg | ORAL_TABLET | Freq: Three times a day (TID) | ORAL | Status: DC
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
## 2015-01-31 NOTE — Discharge Instructions (Signed)
Your workup today did not show any abnormalities.  You are medically cleared.  Expect to be sore for the next 7-10 days.  Wear velcro splint for 5-7 days for comfort.  Cryotherapy Cryotherapy means treatment with cold. Ice or gel packs can be used to reduce both pain and swelling. Ice is the most helpful within the first 24 to 48 hours after an injury or flare-up from overusing a muscle or joint. Sprains, strains, spasms, burning pain, shooting pain, and aches can all be eased with ice. Ice can also be used when recovering from surgery. Ice is effective, has very few side effects, and is safe for most people to use. PRECAUTIONS  Ice is not a safe treatment option for people with:  Raynaud phenomenon. This is a condition affecting small blood vessels in the extremities. Exposure to cold may cause your problems to return.  Cold hypersensitivity. There are many forms of cold hypersensitivity, including:  Cold urticaria. Red, itchy hives appear on the skin when the tissues begin to warm after being iced.  Cold erythema. This is a red, itchy rash caused by exposure to cold.  Cold hemoglobinuria. Red blood cells break down when the tissues begin to warm after being iced. The hemoglobin that carry oxygen are passed into the urine because they cannot combine with blood proteins fast enough.  Numbness or altered sensitivity in the area being iced. If you have any of the following conditions, do not use ice until you have discussed cryotherapy with your caregiver:  Heart conditions, such as arrhythmia, angina, or chronic heart disease.  High blood pressure.  Healing wounds or open skin in the area being iced.  Current infections.  Rheumatoid arthritis.  Poor circulation.  Diabetes. Ice slows the blood flow in the region it is applied. This is beneficial when trying to stop inflamed tissues from spreading irritating chemicals to surrounding tissues. However, if you expose your skin to cold  temperatures for too long or without the proper protection, you can damage your skin or nerves. Watch for signs of skin damage due to cold. HOME CARE INSTRUCTIONS Follow these tips to use ice and cold packs safely.  Place a dry or damp towel between the ice and skin. A damp towel will cool the skin more quickly, so you may need to shorten the time that the ice is used.  For a more rapid response, add gentle compression to the ice.  Ice for no more than 10 to 20 minutes at a time. The bonier the area you are icing, the less time it will take to get the benefits of ice.  Check your skin after 5 minutes to make sure there are no signs of a poor response to cold or skin damage.  Rest 20 minutes or more between uses.  Once your skin is numb, you can end your treatment. You can test numbness by very lightly touching your skin. The touch should be so light that you do not see the skin dimple from the pressure of your fingertip. When using ice, most people will feel these normal sensations in this order: cold, burning, aching, and numbness.  Do not use ice on someone who cannot communicate their responses to pain, such as small children or people with dementia. HOW TO MAKE AN ICE PACK Ice packs are the most common way to use ice therapy. Other methods include ice massage, ice baths, and cryosprays. Muscle creams that cause a cold, tingly feeling do not offer the same  benefits that ice offers and should not be used as a substitute unless recommended by your caregiver. To make an ice pack, do one of the following:  Place crushed ice or a bag of frozen vegetables in a sealable plastic bag. Squeeze out the excess air. Place this bag inside another plastic bag. Slide the bag into a pillowcase or place a damp towel between your skin and the bag.  Mix 3 parts water with 1 part rubbing alcohol. Freeze the mixture in a sealable plastic bag. When you remove the mixture from the freezer, it will be slushy.  Squeeze out the excess air. Place this bag inside another plastic bag. Slide the bag into a pillowcase or place a damp towel between your skin and the bag. SEEK MEDICAL CARE IF:  You develop white spots on your skin. This may give the skin a blotchy (mottled) appearance.  Your skin turns blue or pale.  Your skin becomes waxy or hard.  Your swelling gets worse. MAKE SURE YOU:   Understand these instructions.  Will watch your condition.  Will get help right away if you are not doing well or get worse. Document Released: 07/02/2011 Document Revised: 03/22/2014 Document Reviewed: 07/02/2011 [facility] Patient Information 2015 [place], [place]. This information is not intended to replace advice given to you by your health care provider. Make sure you discuss any questions you have with your health care provider.  Motor Vehicle Collision It is common to have multiple bruises and sore muscles after a motor vehicle collision (MVC). These tend to feel worse for the first 24 hours. You may have the most stiffness and soreness over the first several hours. You may also feel worse when you wake up the first morning after your collision. After this point, you will usually begin to improve with each day. The speed of improvement often depends on the severity of the collision, the number of injuries, and the location and nature of these injuries. HOME CARE INSTRUCTIONS  Put ice on the injured area.  Put ice in a plastic bag.  Place a towel between your skin and the bag.  Leave the ice on for 15-20 minutes, 3-4 times a day, or as directed by your health care provider.  Drink enough fluids to keep your urine clear or pale yellow. Do not drink alcohol.  Take a warm shower or bath once or twice a day. This will increase blood flow to sore muscles.  You may return to activities as directed by your caregiver. Be careful when lifting, as this may aggravate neck or back pain.  Only take over-the-counter  or prescription medicines for pain, discomfort, or fever as directed by your caregiver. Do not use aspirin. This may increase bruising and bleeding. SEEK IMMEDIATE MEDICAL CARE IF:  You have numbness, tingling, or weakness in the arms or legs.  You develop severe headaches not relieved with medicine.  You have severe neck pain, especially tenderness in the middle of the back of your neck.  You have changes in bowel or bladder control.  There is increasing pain in any area of the body.  You have shortness of breath, light-headedness, dizziness, or fainting.  You have chest pain.  You feel sick to your stomach (nauseous), throw up (vomit), or sweat.  You have increasing abdominal discomfort.  There is blood in your urine, stool, or vomit.  You have pain in your shoulder (shoulder strap areas).  You feel your symptoms are getting worse. MAKE SURE YOU:  Understand these instructions.  Will watch your condition.  Will get help right away if you are not doing well or get worse. Document Released: 11/05/2005 Document Revised: 03/22/2014 Document Reviewed: 04/04/2011 [facility] Patient Information 2015 [place], [place]. This information is not intended to replace advice given to you by your health care provider. Make sure you discuss any questions you have with your health care provider.
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
## 2016-10-16 ENCOUNTER — Ambulatory Visit (HOSPITAL_COMMUNITY)
Admission: EM | Admit: 2016-10-16 | Discharge: 2016-10-16 | Disposition: A | Discharge status: Home / Self Care | Payer: Self-pay | Attending: Family Medicine | Admitting: Family Medicine

## 2016-10-16 ENCOUNTER — Encounter (HOSPITAL_COMMUNITY): Payer: Self-pay | Admitting: *Deleted

## 2016-10-16 DIAGNOSIS — M25421 Effusion, right elbow: Secondary | ICD-10-CM

## 2016-10-16 MED ORDER — MELOXICAM 7.5 MG PO TABS: 7.5000 mg | ORAL_TABLET | Freq: Two times a day (BID) | ORAL | 1 refills | Status: DC

## 2016-10-16 NOTE — ED Provider Notes (Signed)
[facility]    CSN: [id number] Arrival date & time: 10/16/16  1022     History   Chief Complaint No chief complaint on file.   HPI Douglas Schroeder is a 50 y.o. male.    Arm Injury  Location:  Elbow Elbow location:  R elbow Injury: no   Pain details:    Quality:  Dull   Severity:  Mild   Onset quality:  Gradual   Duration:  1 month   Progression:  Unchanged Handedness:  Right-handed Dislocation: no   Prior injury to area:  No Relieved by:  None tried Worsened by:  Nothing Ineffective treatments:  None tried Associated symptoms: swelling   Associated symptoms: no decreased range of motion, no fever, no numbness and no stiffness     No past medical history on file.  There are no active problems to display for this patient.   Past Surgical History:  Procedure Laterality Date  . OTHER SURGICAL HISTORY  Facial       Home Medications    Prior to Admission medications   Medication Sig Start Date End Date Taking? Authorizing Provider  guaiFENesin (MUCINEX) 600 MG 12 hr tablet Take 1,200 mg by mouth 2 (two) times daily as needed for cough or to loosen phlegm.    Historical Provider, MD  ibuprofen (ADVIL,MOTRIN) 800 MG tablet Take 1 tablet (800 mg total) by mouth 3 (three) times daily. 01/31/15   Marisa Severinlga Otter, MD  Phenyleph-Doxylamine-DM-APAP (VICKS DAYQUIL/NYQUIL CLD & FLU PO) Take 2 capsules by mouth every 6 (six) hours as needed (cough).    Historical Provider, MD  predniSONE (DELTASONE) 10 MG tablet Take 6 tablets day 1 Take 5 tablets day 2 Take 4 tablets day 3 Take 3 tablets day 4 Take 2 tablets day 5 Take 1 tablet day 6 Patient not taking: Reported on 01/31/2015 05/15/14   Toni Amendourtney Forcucci, PA-C  traMADol (ULTRAM) 50 MG tablet Take 1 tablet (50 mg total) by mouth every 6 (six) hours as needed. Patient not taking: Reported on 01/31/2015 05/15/14   Terri Piedraourtney Forcucci, PA-C    Family History No family history on file.  Social History Social  History  Substance Use Topics  . Smoking status: Current Every Day Smoker    Packs/day: 1.00    Types: Cigarettes  . Smokeless tobacco: Not on file  . Alcohol use Yes     Allergies   Citric acid   Review of Systems Review of Systems  Constitutional: Negative for fever.  Musculoskeletal: Positive for joint swelling. Negative for stiffness.  Skin: Negative.   Neurological: Negative.   All other systems reviewed and are negative.    Physical Exam Triage Vital Signs ED Triage Vitals [10/16/16 1135]  Enc Vitals Group     BP 128/83     Pulse Rate 69     Resp 16     Temp 98 F (36.7 C)     Temp Source Oral     SpO2 98 %     Weight      Height      Head Circumference      Peak Flow      Pain Score      Pain Loc      Pain Edu?      Excl. in GC?    No data found.   Updated Vital Signs BP 128/83 (BP Location: Right Arm)   Pulse 69   Temp 98 F (36.7 C) (Oral)   Resp  16   SpO2 98%   Visual Acuity Right Eye Distance:   Left Eye Distance:   Bilateral Distance:    Right Eye Near:   Left Eye Near:    Bilateral Near:     Physical Exam  Constitutional: He is oriented to person, place, and time. He appears well-developed and well-nourished. No distress.  Musculoskeletal: He exhibits tenderness. He exhibits no deformity.       Right elbow: He exhibits effusion. Tenderness found. Olecranon process tenderness noted.       Arms: Neurological: He is alert and oriented to person, place, and time.  Skin: Skin is warm and dry.  Nursing note and vitals reviewed.    UC Treatments / Results  Labs (all labs ordered are listed, but only abnormal results are displayed) Labs Reviewed - No data to display  EKG  EKG Interpretation None       Radiology No results found.  Procedures Procedures (including critical care time)  Medications Ordered in UC Medications - No data to display   Initial Impression / Assessment and Plan / UC Course  I have reviewed the  triage vital signs and the nursing notes.  Pertinent labs & imaging results that were available during my care of the patient were reviewed by me and considered in my medical decision making (see chart for details).  Clinical Course       Final Clinical Impressions(s) / UC Diagnoses   Final diagnoses:  None    New Prescriptions New Prescriptions   No medications on file     Linna HoffJames D Logun Colavito, MD 10/16/16 1200

## 2016-10-16 NOTE — Discharge Instructions (Signed)
Ice pack once a day, wear ace and use medicine as needed.

## 2016-10-16 NOTE — ED Triage Notes (Signed)
Pt  Has   Swelling  Of the  r  Elbow  -    denys  Any  Injury       Pt    Reports   The  Area  Is  Tender  To   The  Touch      It  Has  Been  Present  For   About  1  Month

## 2016-11-17 ENCOUNTER — Emergency Department (HOSPITAL_COMMUNITY)
Admission: EM | Admit: 2016-11-17 | Discharge: 2016-11-17 | Disposition: A | Discharge status: Home / Self Care | Payer: Self-pay | Attending: Physician Assistant | Admitting: Physician Assistant

## 2016-11-17 ENCOUNTER — Encounter (HOSPITAL_COMMUNITY): Payer: Self-pay

## 2016-11-17 ENCOUNTER — Emergency Department (HOSPITAL_COMMUNITY): Payer: Self-pay

## 2016-11-17 DIAGNOSIS — Y939 Activity, unspecified: Secondary | ICD-10-CM | POA: Insufficient documentation

## 2016-11-17 DIAGNOSIS — Z79899 Other long term (current) drug therapy: Secondary | ICD-10-CM | POA: Insufficient documentation

## 2016-11-17 DIAGNOSIS — F17210 Nicotine dependence, cigarettes, uncomplicated: Secondary | ICD-10-CM | POA: Insufficient documentation

## 2016-11-17 DIAGNOSIS — M7021 Olecranon bursitis, right elbow: Secondary | ICD-10-CM | POA: Insufficient documentation

## 2016-11-17 MED ORDER — PREDNISONE 10 MG (21) PO TBPK: ORAL_TABLET | ORAL | 0 refills | Status: DC

## 2016-11-17 MED ORDER — KETOROLAC TROMETHAMINE 60 MG/2ML IM SOLN
60.0000 mg | Freq: Once | INTRAMUSCULAR | Status: AC
  Administered 2016-11-17: 60 mg via INTRAMUSCULAR
  Filled 2016-11-17: qty 2

## 2016-11-17 MED ORDER — NAPROXEN 500 MG PO TABS: 500.0000 mg | ORAL_TABLET | Freq: Two times a day (BID) | ORAL | 0 refills | Status: DC

## 2016-11-17 NOTE — Discharge Instructions (Signed)
You have what appears to be a bursitis of the elbow. Follow up with the orthopedic surgeon for continued management of this issue. Call the number provided to set up an appointment. Take 500 mg of naproxen every 12 hours or 800 mg of ibuprofen every 8 hours for the next 3 days. Take these medications with food to avoid upset stomach.

## 2016-11-17 NOTE — ED Triage Notes (Signed)
Onset 2 weeks ago right elbow pain and swelling.

## 2016-11-17 NOTE — ED Provider Notes (Signed)
[facility]-EMERGENCY DEPT Provider Note   CSN: [id number] Arrival date & time: 11/17/16  1559     History   Chief Complaint Chief Complaint  Patient presents with  . Joint Swelling    elbow swelling    HPI Douglas Schroeder is a 50 y.o. male.  HPI   Douglas Schroeder is a 50 y.o. male, Patient with no pertinent past medical history, presenting to the ED with Right elbow pain and swelling beginning 2 weeks ago. Patient rates his pain 10 out of 10, aching, nonradiating. He denies fever, wounds, trauma, neuro deficits, or any other complaints.     History reviewed. No pertinent past medical history.  There are no active problems to display for this patient.   Past Surgical History:  Procedure Laterality Date  . OTHER SURGICAL HISTORY  Facial       Home Medications    Prior to Admission medications   Medication Sig Start Date End Date Taking? Authorizing Provider  naproxen sodium (ANAPROX) 220 MG tablet Take 220 mg by mouth 2 (two) times daily as needed (pain).   Yes Historical Provider, MD  ibuprofen (ADVIL,MOTRIN) 800 MG tablet Take 1 tablet (800 mg total) by mouth 3 (three) times daily. 01/31/15   Marisa Severinlga Otter, MD  meloxicam (MOBIC) 7.5 MG tablet Take 1 tablet (7.5 mg total) by mouth 2 (two) times daily after a meal. 10/16/16   Linna HoffJames D Kindl, MD  naproxen (NAPROSYN) 500 MG tablet Take 1 tablet (500 mg total) by mouth 2 (two) times daily. 11/17/16   Douglas Kronick C Jaeleigh Monaco, PA-C  predniSONE (STERAPRED UNI-PAK 21 TAB) 10 MG (21) TBPK tablet Take 6 tabs day 1, 5 tabs day 2, 4 tabs day 3, 3 tabs day 4, 2 tabs day 5, and 1 tab on day 6. 11/17/16   Douglas Skalla C Darenda Fike, PA-C  traMADol (ULTRAM) 50 MG tablet Take 1 tablet (50 mg total) by mouth every 6 (six) hours as needed. Patient not taking: Reported on 01/31/2015 05/15/14   Terri Piedraourtney Forcucci, PA-C    Family History History reviewed. No pertinent family history.  Social History Social History  Substance Use Topics  . Smoking status: Current Every  Day Smoker    Packs/day: 1.00    Types: Cigarettes  . Smokeless tobacco: Never Used  . Alcohol use Yes     Comment: occ     Allergies   Citric acid   Review of Systems Review of Systems  Constitutional: Negative for chills and fever.  Skin: Negative for color change, rash and wound.  Neurological: Negative for weakness and numbness.  All other systems reviewed and are negative.    Physical Exam Updated Vital Signs BP 140/75 (BP Location: Left Arm)   Pulse 84   Temp 98 F (36.7 C) (Oral)   Resp 20   Ht 6' (1.829 m)   Wt 81.6 kg   SpO2 99%   BMI 24.41 kg/m   Physical Exam  Constitutional: He appears well-developed and well-nourished. No distress.  HENT:  Head: Normocephalic and atraumatic.  Eyes: Conjunctivae are normal.  Neck: Neck supple.  Cardiovascular: Normal rate, regular rhythm and intact distal pulses.   Pulmonary/Chest: Effort normal. No respiratory distress.  Abdominal: Soft. There is no tenderness. There is no guarding.  Musculoskeletal: He exhibits edema and tenderness. He exhibits no deformity.  Swelling noted to the posterior right elbow with associated tenderness. Swelling is limited to the area near the olecranon and is noted nowhere else. No noted erythema, increased  warmth, deformity, or crepitus. Full range of motion in the right elbow, although mildly painful.  Lymphadenopathy:    He has no cervical adenopathy.  Neurological: He is alert.  No sensory deficits in the bilateral upper extremities. Strength 5 out of 5 in the bilateral grips and elbows.  Skin: Skin is warm and dry. He is not diaphoretic.  Psychiatric: He has a normal mood and affect. His behavior is normal.  Nursing note and vitals reviewed.    ED Treatments / Results  Labs (all labs ordered are listed, but only abnormal results are displayed) Labs Reviewed - No data to display  EKG  EKG Interpretation None       Radiology Dg Elbow Complete Right  Result Date:  11/17/2016 CLINICAL DATA:  Pain and swelling over the last 2 weeks. Worsening despite ice application. EXAM: RIGHT ELBOW - COMPLETE 3+ VIEW COMPARISON:  None. FINDINGS: Bones and joint surfaces appear normal with the exception of an olecranon spur. There is focal soft tissue swelling over the olecranon most consistent with bursitis. No soft tissue radio opacity. IMPRESSION: Prominent olecranon spur. Overlying soft tissue swelling consistent with bursitis. Electronically Signed   By: Paulina FusiMark  Shogry M.D.   On: 11/17/2016 19:25    Procedures Procedures (including critical care time)  Medications Ordered in ED Medications  ketorolac (TORADOL) injection 60 mg (60 mg Intramuscular Given 11/17/16 1828)     Initial Impression / Assessment and Plan / ED Course  I have reviewed the triage vital signs and the nursing notes.  Pertinent labs & imaging results that were available during my care of the patient were reviewed by me and considered in my medical decision making (see chart for details).  Clinical Course    Patient presents with right elbow pain and swelling over the past 2 weeks. Patient's presentation and imaging results are consistent with olecranon bursitis. Low suspicion for septic joint. Orthopedic follow-up. Return precautions discussed. Patient voiced understanding of these instructions and is comfortable with discharge.  Note: Patient initially wanted us to "cut the swelling out." It was explained to the patient that this would be a bad idea and was not indicated. The reasoning for this was also explained to the patient. Patient voiced understanding.  Findings and plan of care discussed with Bary Castillaourteney Mackuen, MD.   Vitals:   11/17/16 1921 11/17/16 1922 11/17/16 1930 11/17/16 2015  BP: 132/76  122/78 131/85  Pulse:  66 73 71  Resp:      Temp:      TempSrc:      SpO2:  99% 98% 99%  Weight:      Height:         Final Clinical Impressions(s) / ED Diagnoses   Final diagnoses:    Olecranon bursitis of right elbow    New Prescriptions Discharge Medication List as of 11/17/2016  8:22 PM    START taking these medications   Details  naproxen (NAPROSYN) 500 MG tablet Take 1 tablet (500 mg total) by mouth 2 (two) times daily., Starting Sat 11/17/2016, Print    predniSONE (STERAPRED UNI-PAK 21 TAB) 10 MG (21) TBPK tablet Take 6 tabs day 1, 5 tabs day 2, 4 tabs day 3, 3 tabs day 4, 2 tabs day 5, and 1 tab on day 6., Print         Anselm PancoastShawn C Camy Leder, PA-C 11/18/16 0054    Courteney Randall AnLyn Mackuen, MD 11/18/16 1504

## 2016-11-17 NOTE — ED Notes (Signed)
ED Provider at bedside. 

## 2017-04-25 IMAGING — DX DG ELBOW COMPLETE 3+V*R*
4 series · 4 of 4 positions shown · non-contrast
Comparison: None.

CLINICAL DATA: Pain and swelling over the last 2 weeks. Worsening
despite ice application.

EXAM:
RIGHT ELBOW - COMPLETE 3+ VIEW

[elbow ap]
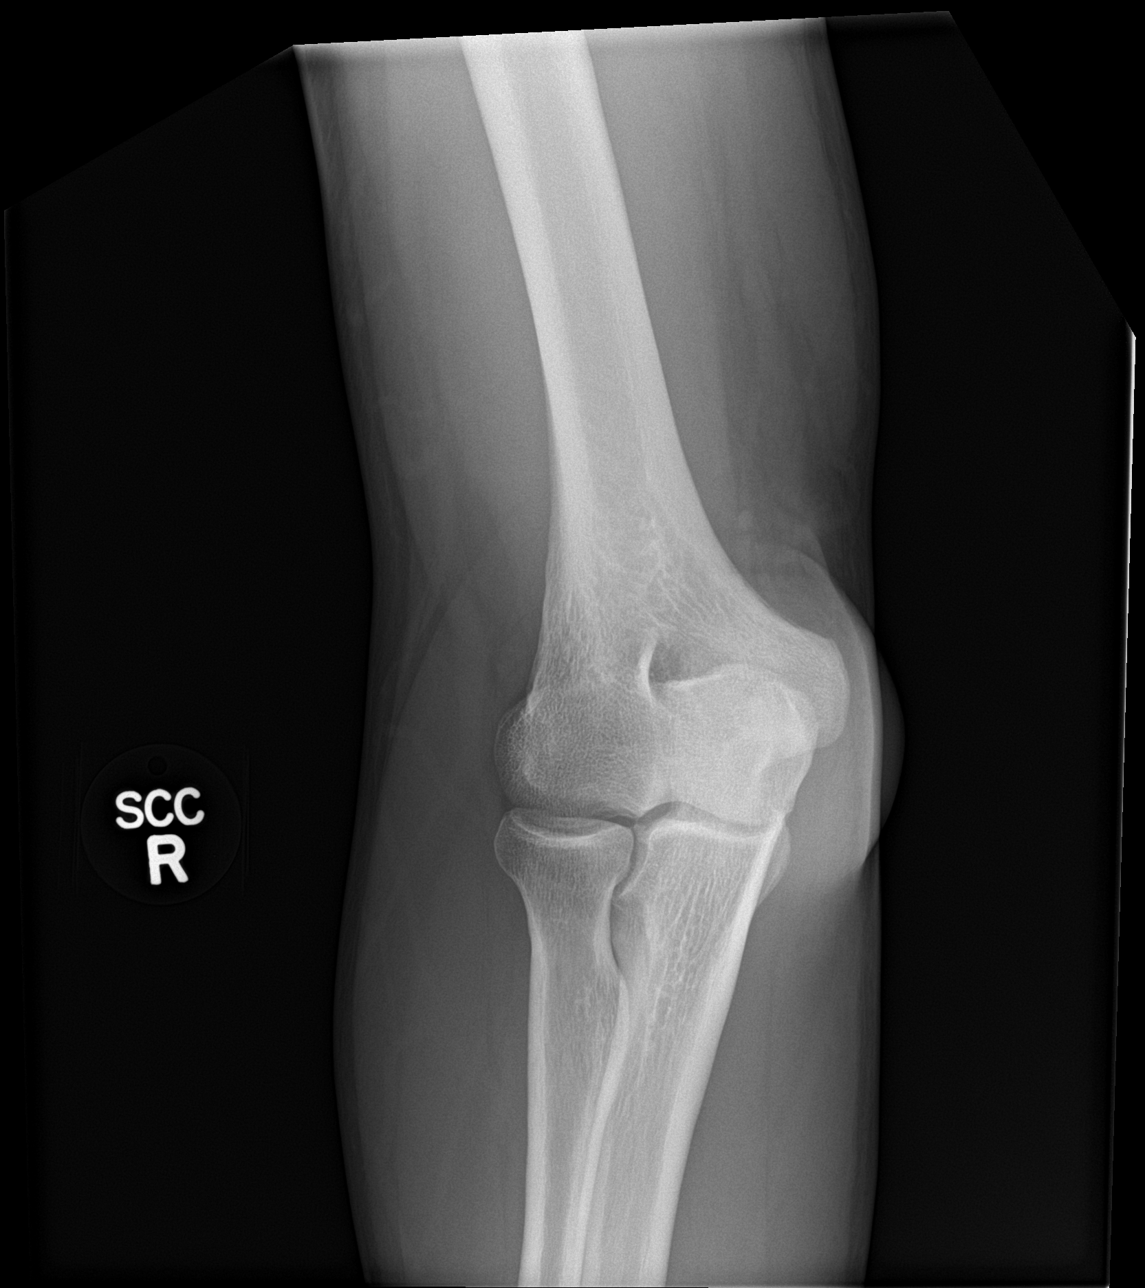

[elbow obl (1 of 2)]
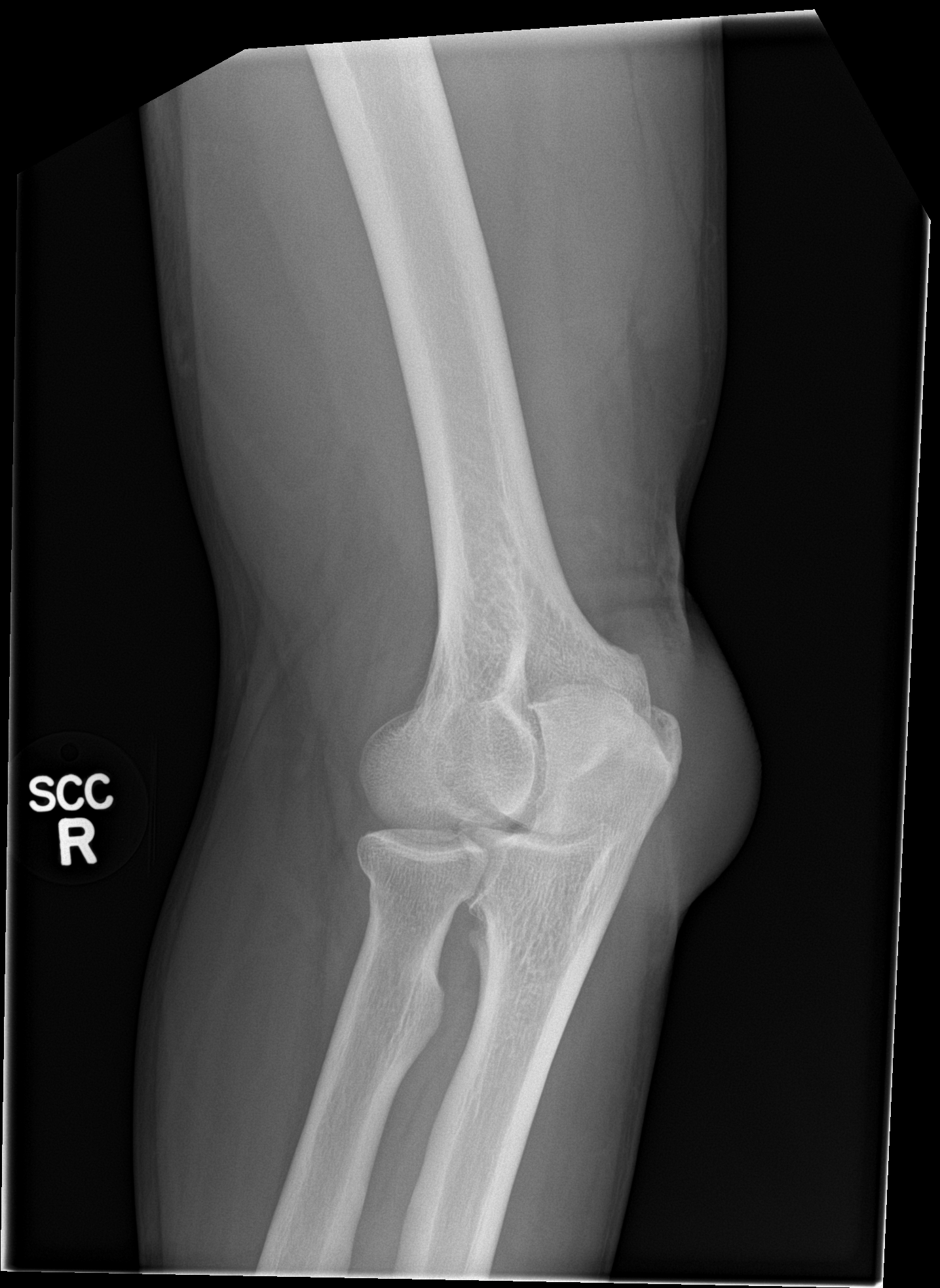

[elbow obl (2 of 2)]
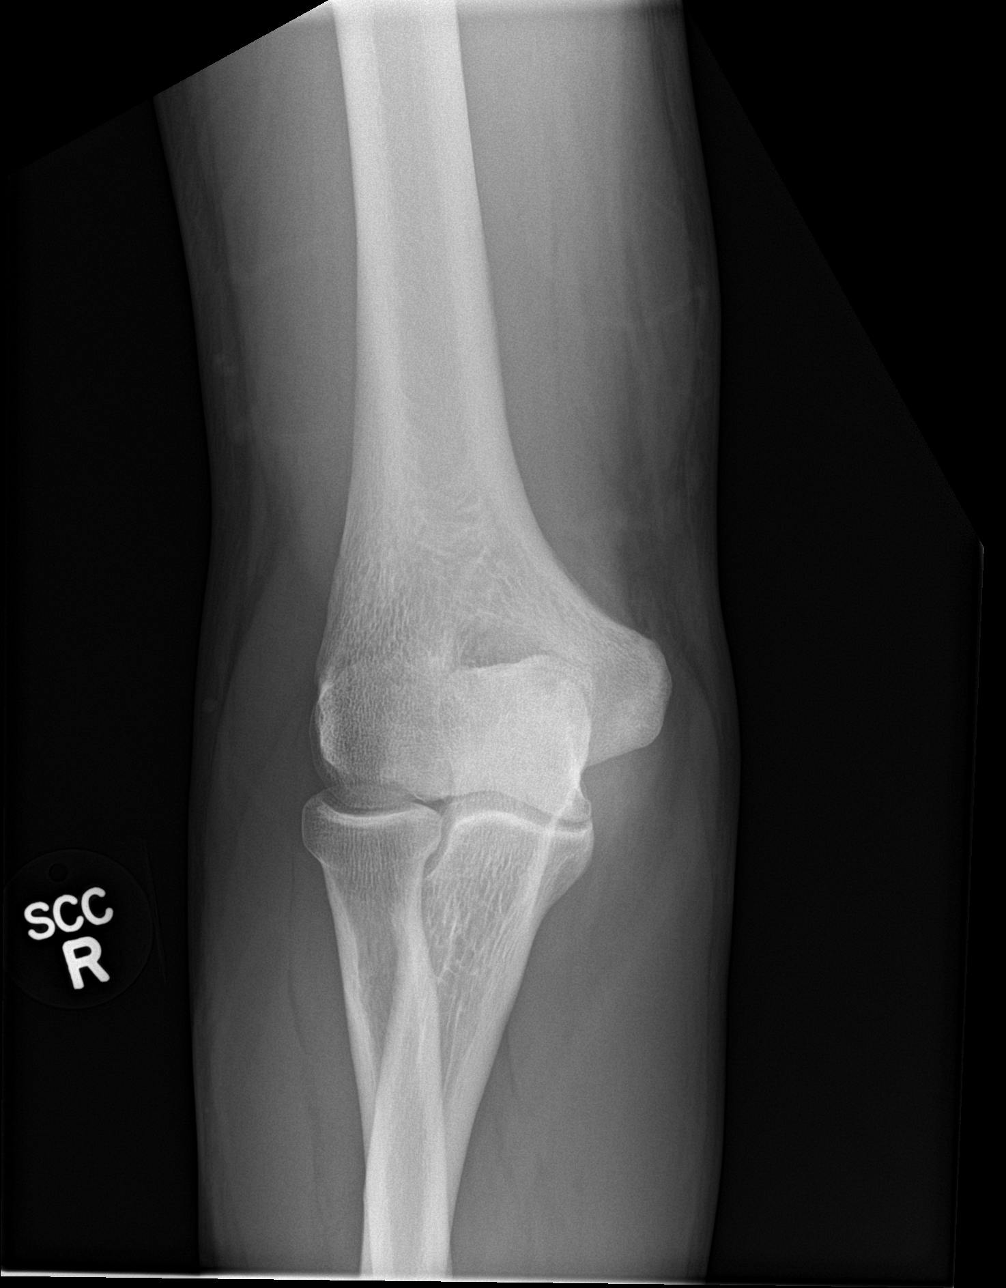

[elbow lat]
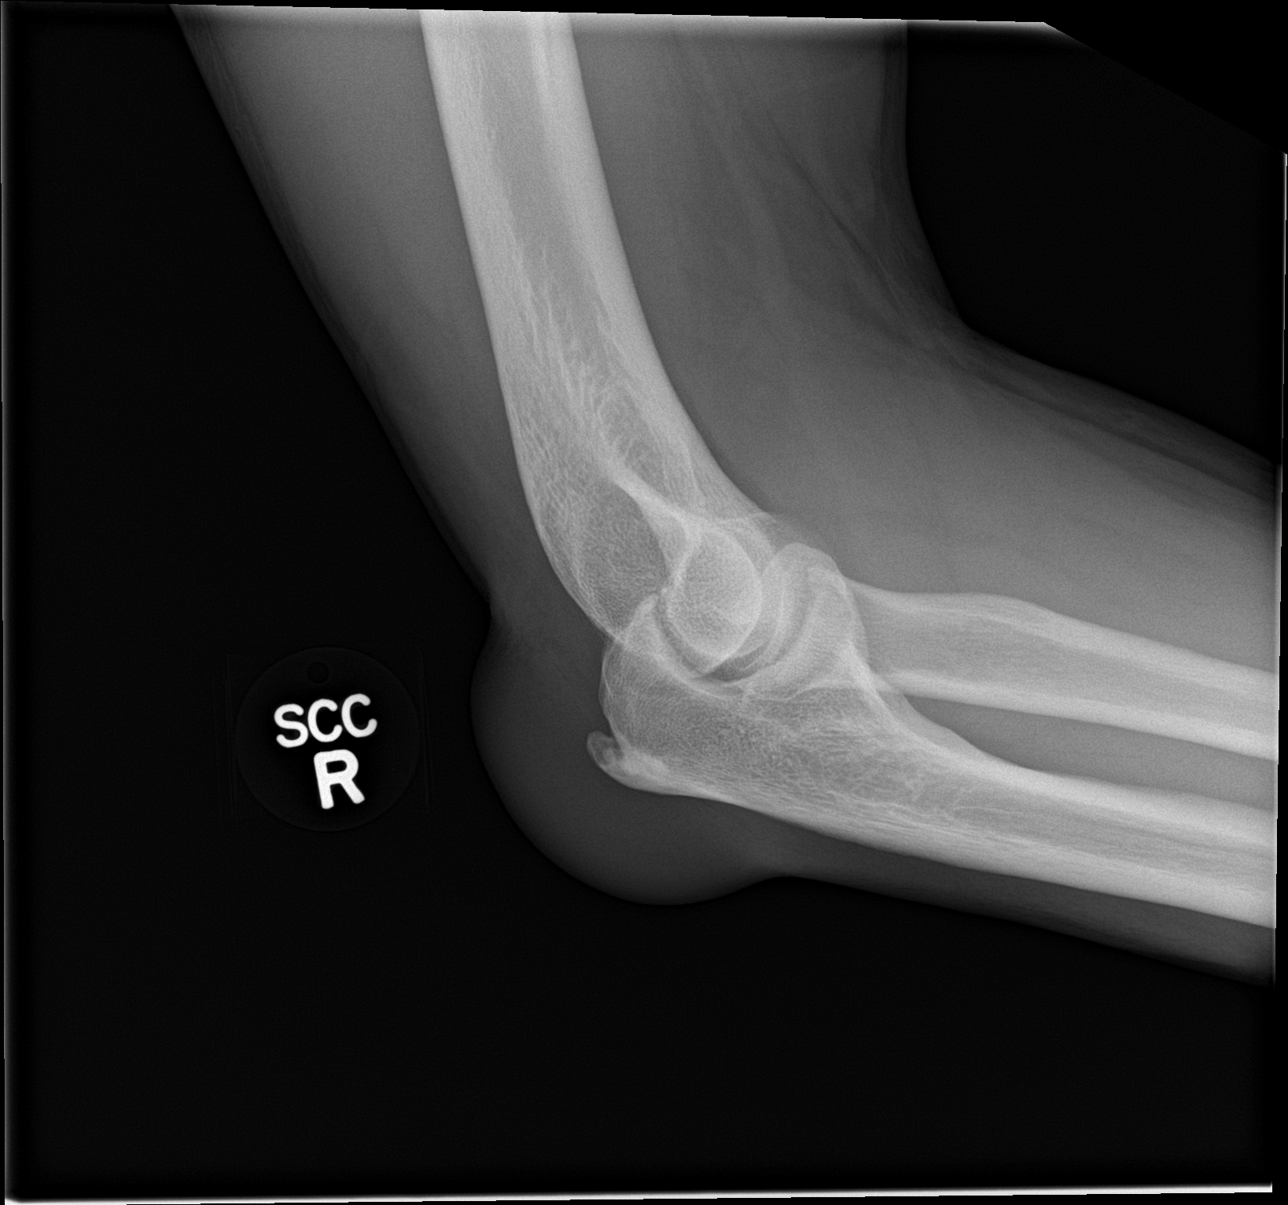

[4 of 4 positions shown; findings below may reference images not displayed]

FINDINGS: Bones and joint surfaces appear normal with the exception of an
olecranon spur. There is focal soft tissue swelling over the
olecranon most consistent with bursitis. No soft tissue radio
opacity.
IMPRESSION: Prominent olecranon spur. Overlying soft tissue swelling consistent
with bursitis.

## 2017-05-15 ENCOUNTER — Emergency Department (HOSPITAL_COMMUNITY)
Admission: EM | Admit: 2017-05-15 | Discharge: 2017-05-16 | Disposition: A | Discharge status: Home / Self Care | Payer: Self-pay | Attending: Emergency Medicine | Admitting: Emergency Medicine

## 2017-05-15 ENCOUNTER — Encounter (HOSPITAL_COMMUNITY): Payer: Self-pay | Admitting: Emergency Medicine

## 2017-05-15 DIAGNOSIS — K610 Anal abscess: Secondary | ICD-10-CM | POA: Insufficient documentation

## 2017-05-15 DIAGNOSIS — F17210 Nicotine dependence, cigarettes, uncomplicated: Secondary | ICD-10-CM | POA: Insufficient documentation

## 2017-05-15 DIAGNOSIS — Z79899 Other long term (current) drug therapy: Secondary | ICD-10-CM | POA: Insufficient documentation

## 2017-05-15 NOTE — ED Triage Notes (Signed)
Pt reports abscess on buttock near scrotum very painful

## 2017-05-15 NOTE — ED Provider Notes (Signed)
[facility]-EMERGENCY DEPT Provider Note   CSN: [id number] Arrival date & time: 05/15/17  1907  By signing my name below, I, Rosana Fret, attest that this documentation has been prepared under the direction and in the presence of Azalia Bilis, MD. Electronically Signed: Rosana Fret, ED Scribe. 05/16/17. 1:06 AM.  History   Chief Complaint Chief Complaint  Patient presents with  . Abscess    on buttock   The history is provided by the patient. No language interpreter was used.   HPI Comments: Douglas Schroeder is a 51 y.o. male with a PMHx of abscesses, who presents to the Emergency Department complaining of a moderate, gradually worsening area of pain and swelling to the gluteal cleft onset 10-14 days ago. Pt states pain is exacerbated with palpation and direct pressure. Denies fever, chills, drainage from the area.   History reviewed. No pertinent past medical history.  There are no active problems to display for this patient.   Past Surgical History:  Procedure Laterality Date  . OTHER SURGICAL HISTORY  Facial       Home Medications    Prior to Admission medications   Medication Sig Start Date End Date Taking? Authorizing Provider  naproxen sodium (ANAPROX) 220 MG tablet Take 220 mg by mouth 2 (two) times daily as needed (pain).   Yes [provider]  ibuprofen (ADVIL,MOTRIN) 800 MG tablet Take 1 tablet (800 mg total) by mouth 3 (three) times daily. Patient not taking: Reported on 05/15/2017 01/31/15   Marisa Severin, MD  meloxicam (MOBIC) 7.5 MG tablet Take 1 tablet (7.5 mg total) by mouth 2 (two) times daily after a meal. Patient not taking: Reported on 05/15/2017 10/16/16   Linna Hoff, MD  naproxen (NAPROSYN) 500 MG tablet Take 1 tablet (500 mg total) by mouth 2 (two) times daily. Patient not taking: Reported on 05/15/2017 11/17/16   Joy, Hillard Danker, PA-C  predniSONE (STERAPRED UNI-PAK 21 TAB) 10 MG (21) TBPK tablet Take 6 tabs day 1, 5 tabs day 2, 4 tabs day 3, 3  tabs day 4, 2 tabs day 5, and 1 tab on day 6. Patient not taking: Reported on 05/15/2017 11/17/16   Joy, Hillard Danker, PA-C  traMADol (ULTRAM) 50 MG tablet Take 1 tablet (50 mg total) by mouth every 6 (six) hours as needed. Patient not taking: Reported on 01/31/2015 05/15/14   Terri Piedra, PA-C    Family History History reviewed. No pertinent family history.  Social History Social History  Substance Use Topics  . Smoking status: Current Every Day Smoker    Packs/day: 1.00    Types: Cigarettes  . Smokeless tobacco: Never Used  . Alcohol use Yes     Comment: occ     Allergies   Citric acid   Review of Systems Review of Systems All other systems reviewed and are negative for acute change except as noted in the HPI.  Physical Exam Updated Vital Signs BP 121/78   Pulse 96   Temp 98.2 F (36.8 C) (Oral)   Resp 18   SpO2 98%   Physical Exam  Constitutional: He is oriented to person, place, and time. He appears well-developed and well-nourished.  HENT:  Head: Normocephalic.  Eyes: EOM are normal.  Neck: Normal range of motion.  Pulmonary/Chest: Effort normal.  Abdominal: He exhibits no distension.  Genitourinary:  Genitourinary Comments: Perianal swelling and fluctuance without drainage.  Mild perineal swelling.  No crepitus.  Musculoskeletal: Normal range of motion.  Neurological: He is alert  and oriented to person, place, and time.  Psychiatric: He has a normal mood and affect.  Nursing note and vitals reviewed.    ED Treatments / Results  DIAGNOSTIC STUDIES: Oxygen Saturation is 98% on RA, normal by my interpretation.   COORDINATION OF CARE: 11:58 AM-Discussed next steps with pt including I&D. Pt verbalized understanding and is agreeable with the plan.   Labs (all labs ordered are listed, but only abnormal results are displayed) Labs Reviewed - No data to display  EKG  EKG Interpretation None       Radiology No results  found.  Procedures .Marland Kitchen.Incision and Drainage Date/Time: 05/16/2017 12:40 AM Performed by: Azalia BilisAMPOS, Tristen Luce Authorized by: Azalia BilisAMPOS, Olander Friedl   Consent:    Consent obtained:  Verbal   Consent given by:  Patient   Risks discussed:  Pain, bleeding and incomplete drainage Location:    Type:  Abscess   Location:  Anogenital   Anogenital location:  Gluteal cleft Pre-procedure details:    Skin preparation:  Betadine Anesthesia (see MAR for exact dosages):    Anesthesia method:  Local infiltration   Local anesthetic:  Lidocaine 2% WITH epi Procedure type:    Complexity:  Simple Procedure details:    Incision types:  Stab incision   Scalpel blade:  11   Wound management:  Irrigated with saline   Drainage:  Bloody and purulent   Drainage amount:  Moderate   Wound treatment:  Wound left open   Packing materials:  1/2 in gauze Post-procedure details:    Patient tolerance of procedure:  Tolerated well, no immediate complications        Medications Ordered in ED Medications  oxyCODONE-acetaminophen (PERCOCET/ROXICET) 5-325 MG per tablet 1 tablet (1 tablet Oral Given 05/16/17 0038)  lidocaine-EPINEPHrine (XYLOCAINE W/EPI) 2 %-1:200000 (PF) injection 20 mL (20 mLs Infiltration Given by Other 05/16/17 0038)     Initial Impression / Assessment and Plan / ED Course  I have reviewed the triage vital signs and the nursing notes.  Pertinent labs & imaging results that were available during my care of the patient were reviewed by me and considered in my medical decision making (see chart for details).     Perianal and early perineum abscess.  Doubt necrotizing fasciitis.  Culture sent.  Home with Bactrim.  Patient understands return to the ER for new or worsening symptoms  Final Clinical Impressions(s) / ED Diagnoses   Final diagnoses:  Perianal abscess    New Prescriptions New Prescriptions   SULFAMETHOXAZOLE-TRIMETHOPRIM (BACTRIM DS,SEPTRA DS) 800-160 MG TABLET    Take 1 tablet by mouth  2 (two) times daily.   I personally performed the services described in this documentation, which was scribed in my presence. The recorded information has been reviewed and is accurate.        Azalia Bilisampos, Tanita Palinkas, MD 05/16/17 0111

## 2017-05-16 MED ORDER — OXYCODONE-ACETAMINOPHEN 5-325 MG PO TABS
1.0000 | ORAL_TABLET | Freq: Once | ORAL | Status: AC
  Administered 2017-05-16: 1 via ORAL
  Filled 2017-05-16: qty 1

## 2017-05-16 MED ORDER — SULFAMETHOXAZOLE-TRIMETHOPRIM 800-160 MG PO TABS: 1.0000 | ORAL_TABLET | Freq: Two times a day (BID) | ORAL | 0 refills | Status: AC

## 2017-05-16 MED ORDER — LIDOCAINE-EPINEPHRINE (PF) 2 %-1:200000 IJ SOLN
20.0000 mL | Freq: Once | INTRAMUSCULAR | Status: AC
  Administered 2017-05-16: 20 mL
  Filled 2017-05-16: qty 20

## 2017-05-18 LAB — AEROBIC CULTURE  (SUPERFICIAL SPECIMEN): SPECIAL REQUESTS: NORMAL

## 2017-05-19 ENCOUNTER — Emergency Department (HOSPITAL_COMMUNITY)
Admission: EM | Admit: 2017-05-19 | Discharge: 2017-05-19 | Disposition: A | Discharge status: Home / Self Care | Payer: Self-pay | Attending: Emergency Medicine | Admitting: Emergency Medicine

## 2017-05-19 ENCOUNTER — Encounter (HOSPITAL_COMMUNITY): Payer: Self-pay | Admitting: Emergency Medicine

## 2017-05-19 ENCOUNTER — Telehealth: Payer: Self-pay

## 2017-05-19 DIAGNOSIS — Z4801 Encounter for change or removal of surgical wound dressing: Secondary | ICD-10-CM | POA: Insufficient documentation

## 2017-05-19 DIAGNOSIS — L0291 Cutaneous abscess, unspecified: Secondary | ICD-10-CM

## 2017-05-19 MED ORDER — LIDOCAINE-EPINEPHRINE (PF) 2 %-1:200000 IJ SOLN
20.0000 mL | Freq: Once | INTRAMUSCULAR | Status: AC
  Administered 2017-05-19: 20 mL
  Filled 2017-05-19: qty 20

## 2017-05-19 NOTE — ED Provider Notes (Signed)
[facility]-EMERGENCY DEPT Provider Note   CSN: [id number] Arrival date & time: 05/19/17  1731  By signing my name below, I, Vista Mink, attest that this documentation has been prepared under the direction and in the presence of Khamiyah Grefe PA-C.  Electronically Signed: Vista Mink, ED Scribe. 05/19/17. 6:57 PM.   History   Chief Complaint Chief Complaint  Patient presents with  . wound packing removal    HPI HPI Comments: Douglas Schroeder is a 51 y.o. male who presents to the Emergency Department for a wound packing removal s/p I&D of abscess four days ago. Pt was seen here four days ago for a perianal abscess. Pt had wound drained, packed and was instructed to return here for packing removal. He was placed on Bactrim which he has been compliant in taking so far.  Since this procedure he states that the area is still mildly painful and states that the packing material was still in there. He but denies any further complications. No recent fever or chills noted. No nausea or vomiting.  Per chart review, patient abscess was packed with half-inch gauze, and he tolerated procedure well. He was instructed to return for any new or worsening symptoms He was called to change his abx today from bactrim to Augmentin per culture report, but states that he would wait until he got to the ED.   The history is provided by the patient. No language interpreter was used.    History reviewed. No pertinent past medical history.  There are no active problems to display for this patient.   Past Surgical History:  Procedure Laterality Date  . OTHER SURGICAL HISTORY  Facial     Home Medications    Prior to Admission medications   Medication Sig Start Date End Date Taking? Authorizing Provider  naproxen sodium (ANAPROX) 220 MG tablet Take 220 mg by mouth 2 (two) times daily as needed (pain).    [provider]  sulfamethoxazole-trimethoprim (BACTRIM DS,SEPTRA DS) 800-160 MG tablet Take 1 tablet  by mouth 2 (two) times daily. 05/16/17 05/23/17  Azalia Bilis, MD    Family History No family history on file.  Social History Social History  Substance Use Topics  . Smoking status: Current Every Day Smoker    Packs/day: 1.00    Types: Cigarettes  . Smokeless tobacco: Never Used  . Alcohol use Yes     Comment: occ     Allergies   Citric acid   Review of Systems Review of Systems  Constitutional: Negative for chills and fever.  Gastrointestinal: Negative for nausea and vomiting.  Skin: Positive for wound.     Physical Exam Updated Vital Signs BP 125/77 (BP Location: Right Arm)   Pulse 83   Temp 98.7 F (37.1 C) (Oral)   Resp 16   SpO2 96%   Physical Exam  Constitutional: He is oriented to person, place, and time. He appears well-developed and well-nourished. No distress.  HENT:  Head: Normocephalic and atraumatic.  Eyes: EOM are normal. Pupils are equal, round, and reactive to light.  Neck: Normal range of motion.  Cardiovascular: Normal rate and regular rhythm.   Pulmonary/Chest: Effort normal and breath sounds normal.  Abdominal: Soft. There is no tenderness.  Genitourinary: Testes normal.  Genitourinary Comments: Tenderness and swelling to the left perianal area with the overlying 1 cm partially healed incision. No obvious erythema or cellulitis at the site. Area is firm, not fluctuant.   Musculoskeletal: Normal range of motion.  Neurological: He is  alert and oriented to person, place, and time.  Skin: Skin is warm and dry. He is not diaphoretic.  Psychiatric: He has a normal mood and affect.  Nursing note and vitals reviewed.    ED Treatments / Results  DIAGNOSTIC STUDIES: Oxygen Saturation is 96% on RA, normal by my interpretation.  COORDINATION OF CARE: 6:58 PM-Discussed treatment plan with pt at bedside and pt agreed to plan.   Labs (all labs ordered are listed, but only abnormal results are displayed) Labs Reviewed - No data to display  EKG   EKG Interpretation None       Radiology No results found.  Procedures .Marland KitchenIncision and Drainage Date/Time: 05/19/2017 7:43 PM Performed by: Alveria Apley Authorized by: Alveria Apley   Consent:    Consent obtained:  Verbal   Consent given by:  Patient   Risks discussed:  Bleeding and infection   Alternatives discussed:  No treatment Location:    Indications for incision and drainage: Previously drained abscess.   Location:  Anogenital   Anogenital location:  Perianal Pre-procedure details:    Skin preparation:  Betadine Anesthesia (see MAR for exact dosages):    Anesthesia method:  Local infiltration   Local anesthetic:  Lidocaine 2% WITH epi Procedure type:    Complexity:  Simple Procedure details:    Wound management:  Irrigated with saline and probed and deloculated   Drainage:  Bloody   Drainage amount:  Scant   Wound treatment:  Wound left open   Packing materials:  None Post-procedure details:    Patient tolerance of procedure:  Tolerated well, no immediate complications Comments:     Once numb, incision was able to be opened with needle drivers (no scalpel used), and wound was explored. No obvious foreign material was found. Wound was irrigated with sterile saline. No signs of purulent drainage or infection at this time.   (including critical care time)  Medications Ordered in ED Medications  lidocaine-EPINEPHrine (XYLOCAINE W/EPI) 2 %-1:200000 (PF) injection 20 mL (20 mLs Infiltration Given 05/19/17 1915)     Initial Impression / Assessment and Plan / ED Course  I have reviewed the triage vital signs and the nursing notes.  Pertinent labs & imaging results that were available during my care of the patient were reviewed by me and considered in my medical decision making (see chart for details).     Patient presenting for packing removal following I&D last week. Patient reports he cannot tell if he is improving, as he feels the packing material is  still in. He states that he thought he saw a string fall out yesterday, but the rest the packing material stuck in there and he cannot get it out. He denies fever, chills, or worsening pain. On exam, incision site is partially healed, and to remove any potential packing material, site would have to be reopened. This was discussed with patient, patient states that he would like to have the site probed and irrigated.  Area was numbed, and irrigated. No obvious packing material was expressed. There was no purulent drainage, and site appeared to be healing well. Following procedure, patient states that he feels much better, and feels as if there is less pain. Per chart review, patient is supposed to change from Bactrim to Augmentin for abscess coverage. At this time there is no sign of infection of the abscess, no purulent drainage, and no surrounding cellulitis. Patient without systemic fever or chills. At this time, I do not feel the patient will benefit  from starting new antibiotics. Return precautions given. Patient states he understands and agrees to plan  Final Clinical Impressions(s) / ED Diagnoses   Final diagnoses:  Abscess    New Prescriptions Discharge Medication List as of 05/19/2017  7:40 PM    I personally performed the services described in this documentation, which was scribed in my presence. The recorded information has been reviewed and is accurate.    Alveria ApleyCaccavale, Adrinne Sze, PA-C 05/19/17 2019    Jacalyn LefevreHaviland, Julie, MD 05/19/17 2250

## 2017-05-19 NOTE — Discharge Instructions (Signed)
Keep the area as clean as possible. There will continue to be some drainage, this is normal. The drainage should lessen each day. Change the dressing daily or whenever it gets dirty. You may use Tylenol or ibuprofen as needed for pain control. Return to the emergency department if you develop fever, chills, worsening pain, or any new or worsening symptoms.

## 2017-05-19 NOTE — Progress Notes (Signed)
ED Antimicrobial Stewardship Positive Culture Follow Up   Douglas Schroeder is an 51 y.o. male who [facility] with a chief complaint of  Chief Complaint  Patient presents with  . Abscess    on buttock    Recent Results (from the past 720 hour(s))  Wound or Superficial Culture     Status: None   Collection Time: 05/16/17  1:12 AM  Result Value Ref Range Status   Specimen Description WOUND PERIANUS  Final   Special Requests Normal  Final   Gram Stain   Final    RARE WBC PRESENT,BOTH PMN AND MONONUCLEAR MODERATE GRAM POSITIVE COCCI IN CLUSTERS ABUNDANT GRAM POSITIVE COCCI IN PAIRS MODERATE GRAM POSITIVE RODS Performed at [facility], [address] [place], [place] [address]    Culture ABUNDANT ACTINOMYCES SPECIES  Final   Report Status 05/18/2017 FINAL  Final    [x]  Treated with bactrim, organism resistant to prescribed antimicrobial []  Patient discharged originally without antimicrobial agent and treatment is now indicated  New antibiotic prescription: DC bactrim, start augmentin 875mg  PO BID x 5 days  ED Provider: Graciella FreerLindsey Layden, PA   Taylr Meuth, Drake Leachachel Lynn 05/19/2017, 10:22 AM Clinical Pharmacist Phone# [phone]

## 2017-05-19 NOTE — Telephone Encounter (Signed)
Spoke with Pt regarding need to change Abx treatment to Augmentin per abcess culture report. Pt states he wants to wait til he comes back this evening after work because [facility]. Pt needs Augmentin 875mg  PO BID x 5 days per Graciella FreerLindsey Layden [facility]

## 2017-05-19 NOTE — ED Triage Notes (Signed)
Patient reports that he was seen here 4 days ago for abscess in buttock area. Pt was told to come back and get packing removed.

## 2017-05-21 NOTE — Telephone Encounter (Signed)
Post ED Visit - Positive Culture Follow-up: Successful Patient Follow-Up  Culture assessed and recommendations reviewed by: []  Enzo BiNathan Batchelder, Pharm.D. []  Celedonio MiyamotoJeremy Frens, Pharm.D., BCPS AQ-ID []  Garvin FilaMike Maccia, Pharm.D., BCPS []  Georgina PillionElizabeth Martin, [address]Ph[address] BCPS []  LimaMinh Pham, [address]Ph[address]D., BCPS, AAHIVP []  Estella HuskMichelle Turner, Pharm.D., BCPS, AAHIVP [x]  Lysle Pearlachel Rumbarger, PharmD, BCPS []  Casilda Carlsaylor Stone, PharmD, BCPS []  Pollyann SamplesAndy Johnston, PharmD, BCPS  Positive wound culture  []  Patient discharged without antimicrobial prescription and treatment is now indicated [x]  Organism is resistant to prescribed ED discharge antimicrobial []  Patient with positive blood cultures  Changes discussed with ED provider: Graciella FreerLindsey Layden PA New antibiotic prescription stop Septra and start augmentin 875mg  po bid x 5 days   Attempting to contact patient   Douglas MullMiller, Douglas Schroeder 05/21/2017, 1:13 PM

## 2017-06-21 ENCOUNTER — Emergency Department (HOSPITAL_COMMUNITY)
Admission: EM | Admit: 2017-06-21 | Discharge: 2017-06-21 | Disposition: A | Discharge status: Home / Self Care | Payer: No Typology Code available for payment source | Attending: Emergency Medicine | Admitting: Emergency Medicine

## 2017-06-21 ENCOUNTER — Encounter (HOSPITAL_COMMUNITY): Payer: Self-pay

## 2017-06-21 DIAGNOSIS — S46812A Strain of other muscles, fascia and tendons at shoulder and upper arm level, left arm, initial encounter: Secondary | ICD-10-CM

## 2017-06-21 DIAGNOSIS — S3992XA Unspecified injury of lower back, initial encounter: Secondary | ICD-10-CM | POA: Insufficient documentation

## 2017-06-21 DIAGNOSIS — F17210 Nicotine dependence, cigarettes, uncomplicated: Secondary | ICD-10-CM | POA: Diagnosis not present

## 2017-06-21 DIAGNOSIS — Y939 Activity, unspecified: Secondary | ICD-10-CM | POA: Insufficient documentation

## 2017-06-21 DIAGNOSIS — Y999 Unspecified external cause status: Secondary | ICD-10-CM | POA: Insufficient documentation

## 2017-06-21 DIAGNOSIS — M545 Low back pain, unspecified: Secondary | ICD-10-CM

## 2017-06-21 DIAGNOSIS — Y92410 Unspecified street and highway as the place of occurrence of the external cause: Secondary | ICD-10-CM | POA: Insufficient documentation

## 2017-06-21 DIAGNOSIS — S40922A Unspecified superficial injury of left upper arm, initial encounter: Secondary | ICD-10-CM | POA: Diagnosis present

## 2017-06-21 MED ORDER — CYCLOBENZAPRINE HCL 10 MG PO TABS: 10.0000 mg | ORAL_TABLET | Freq: Every evening | ORAL | 0 refills | Status: DC | PRN

## 2017-06-21 MED ORDER — NAPROXEN 250 MG PO TABS
500.0000 mg | ORAL_TABLET | Freq: Once | ORAL | Status: AC
  Administered 2017-06-21: 500 mg via ORAL
  Filled 2017-06-21: qty 2

## 2017-06-21 MED ORDER — NAPROXEN 500 MG PO TABS: 500.0000 mg | ORAL_TABLET | Freq: Two times a day (BID) | ORAL | 0 refills | Status: DC

## 2017-06-21 MED ORDER — HYDROCODONE-ACETAMINOPHEN 5-325 MG PO TABS: 1.0000 | ORAL_TABLET | Freq: Four times a day (QID) | ORAL | 0 refills | Status: DC | PRN

## 2017-06-21 NOTE — ED Notes (Signed)
Pt verbalized understanding discharge instructions and denies any further needs or questions at this time. VS stable, ambulatory and steady gait.   
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
## 2017-06-21 NOTE — Discharge Instructions (Signed)
Please read instructions below. Apply ice to your back for 20 minutes at a time. You can take aleve/naproxen 2 times per day with meals needed for mild to moderate pain. You can Norco every 6 hours as needed for severe pain. Do not drive, drink alcohol, or take Tylenol while taking this medication. Schedule an appointment with your PCP if symptoms persist. Return to ER for headache, vision changes, vomiting, new numbness or tingling in your arms or legs, inability to urinate, inability to hold your bowels, or weakness in your extremities.
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
## 2017-10-14 ENCOUNTER — Telehealth: Payer: Self-pay | Admitting: Emergency Medicine

## 2017-10-14 NOTE — Telephone Encounter (Signed)
Lost to followup 

## 2018-02-17 ENCOUNTER — Other Ambulatory Visit: Payer: Self-pay

## 2018-02-17 ENCOUNTER — Encounter (HOSPITAL_COMMUNITY): Payer: Self-pay

## 2018-02-17 ENCOUNTER — Emergency Department (HOSPITAL_COMMUNITY)
Admission: EM | Admit: 2018-02-17 | Discharge: 2018-02-17 | Disposition: A | Discharge status: Home / Self Care | Payer: Self-pay | Attending: Emergency Medicine | Admitting: Emergency Medicine

## 2018-02-17 DIAGNOSIS — Z79899 Other long term (current) drug therapy: Secondary | ICD-10-CM | POA: Insufficient documentation

## 2018-02-17 DIAGNOSIS — F17210 Nicotine dependence, cigarettes, uncomplicated: Secondary | ICD-10-CM | POA: Insufficient documentation

## 2018-02-17 DIAGNOSIS — A084 Viral intestinal infection, unspecified: Secondary | ICD-10-CM | POA: Insufficient documentation

## 2018-02-17 LAB — COMPREHENSIVE METABOLIC PANEL
ALT: 15 U/L — ABNORMAL LOW (ref 17–63)
ANION GAP: 10 (ref 5–15)
AST: 19 U/L (ref 15–41)
Albumin: 3.8 g/dL (ref 3.5–5.0)
Alkaline Phosphatase: 99 U/L (ref 38–126)
BUN: 7 mg/dL (ref 6–20)
CHLORIDE: 103 mmol/L (ref 101–111)
CO2: 25 mmol/L (ref 22–32)
Calcium: 9.1 mg/dL (ref 8.9–10.3)
Creatinine, Ser: 1.04 mg/dL (ref 0.61–1.24)
Glucose, Bld: 70 mg/dL (ref 65–99)
POTASSIUM: 3.9 mmol/L (ref 3.5–5.1)
SODIUM: 138 mmol/L (ref 135–145)
Total Bilirubin: 0.5 mg/dL (ref 0.3–1.2)
Total Protein: 6.3 g/dL — ABNORMAL LOW (ref 6.5–8.1)

## 2018-02-17 LAB — CBC
HEMATOCRIT: 48.7 % (ref 39.0–52.0)
HEMOGLOBIN: 16.3 g/dL (ref 13.0–17.0)
MCH: 30.9 pg (ref 26.0–34.0)
MCHC: 33.5 g/dL (ref 30.0–36.0)
MCV: 92.4 fL (ref 78.0–100.0)
Platelets: 144 10*3/uL — ABNORMAL LOW (ref 150–400)
RBC: 5.27 MIL/uL (ref 4.22–5.81)
RDW: 13.9 % (ref 11.5–15.5)
WBC: 4.5 10*3/uL (ref 4.0–10.5)

## 2018-02-17 LAB — LIPASE, BLOOD: LIPASE: 26 U/L (ref 11–51)

## 2018-02-17 MED ORDER — ONDANSETRON HCL 4 MG PO TABS
4.0000 mg | ORAL_TABLET | Freq: Once | ORAL | Status: AC
  Administered 2018-02-17: 4 mg via ORAL
  Filled 2018-02-17: qty 1

## 2018-02-17 MED ORDER — ONDANSETRON HCL 4 MG PO TABS: 4.0000 mg | ORAL_TABLET | Freq: Four times a day (QID) | ORAL | 0 refills | Status: DC

## 2018-02-17 NOTE — Discharge Instructions (Addendum)
Please read attached information. If you experience any new or worsening signs or symptoms please return to the emergency room for evaluation. Please follow-up with your primary care provider or specialist as discussed. Please use medication prescribed only as directed and discontinue taking if you have any concerning signs or symptoms.   °

## 2018-02-17 NOTE — ED Notes (Signed)
Pt verbalized understanding of discharge paperwork. Ambulatory, A&Ox4 at discharge.

## 2018-02-17 NOTE — ED Provider Notes (Signed)
[facility]   CSN: [id number] Arrival date & time: 02/17/18  1019     History   Chief Complaint Chief Complaint  Patient presents with  . Abdominal Pain    HPI Douglas Schroeder is a 52 y.o. male.  HPI   52 year old male presents today with complaints of nausea vomiting diarrhea and abdominal pain.  Patient has a 2-day history of the same.  He notes his children is sick with similar symptoms.  He denies any fever, denies any localized abdominal pain.  Patient reports very little p.o. intake and notes that his vomiting is generally after eating or drinking.  Patient also notes his girlfriend is sick with similar symptoms.  History reviewed. No pertinent past medical history.  There are no active problems to display for this patient.   Past Surgical History:  Procedure Laterality Date  . OTHER SURGICAL HISTORY  Facial        Home Medications    Prior to Admission medications   Medication Sig Start Date End Date Taking? Authorizing Provider  cyclobenzaprine (FLEXERIL) 10 MG tablet Take 1 tablet (10 mg total) by mouth at bedtime as needed for muscle spasms. 06/21/17   Robinson, [place] N, PA-C  HYDROcodone-acetaminophen (NORCO/VICODIN) 5-325 MG tablet Take 1-2 tablets by mouth every 6 (six) hours as needed for severe pain. 06/21/17   Robinson, [place] N, PA-C  naproxen (NAPROSYN) 500 MG tablet Take 1 tablet (500 mg total) by mouth 2 (two) times daily. 06/21/17   Robinson, [place] N, PA-C  naproxen sodium (ANAPROX) 220 MG tablet Take 220 mg by mouth 2 (two) times daily as needed (pain).    [provider]  ondansetron (ZOFRAN) 4 MG tablet Take 1 tablet (4 mg total) by mouth every 6 (six) hours. 02/17/18   Eyvonne MechanicHedges, Shaniah Baltes, PA-C    Family History No family history on file.  Social History Social History   Tobacco Use  . Smoking status: Current Every Day Smoker    Packs/day: 1.00    Types: Cigarettes  . Smokeless  tobacco: Never Used  Substance Use Topics  . Alcohol use: Yes    Comment: occ  . Drug use: No     Allergies   Citric acid   Review of Systems Review of Systems  All other systems reviewed and are negative.  Physical Exam Updated Vital Signs BP 133/83 (BP Location: Right Arm)   Pulse 62   Temp 97.6 F (36.4 C) (Oral)   Resp 16   Ht 6\' 1"  (1.854 m)   Wt 86.2 kg (190 lb)   SpO2 98%   BMI 25.07 kg/m   Physical Exam  Constitutional: He is oriented to person, place, and time. He appears well-developed and well-nourished.  HENT:  Head: Normocephalic and atraumatic.  Eyes: Pupils are equal, round, and reactive to light. Conjunctivae are normal. Right eye exhibits no discharge. Left eye exhibits no discharge. No scleral icterus.  Neck: Normal range of motion. No JVD present. No tracheal deviation present.  Pulmonary/Chest: Effort normal. No stridor.  Minimal tenderness palpation of the epigastric region, remainder of abdominal exam nontender  Abdominal: He exhibits no distension and no mass. There is tenderness. There is no rebound and no guarding. No hernia.  Minimal tenderness palpation of the epigastric region, remainder of abdominal exam nontender  Neurological: He is alert and oriented to person, place, and time. Coordination normal.  Psychiatric: He has a normal mood and affect. His behavior is normal.  Judgment and thought content normal.  Nursing note and vitals reviewed.    ED Treatments / Results  Labs (all labs ordered are listed, but only abnormal results are displayed) Labs Reviewed  COMPREHENSIVE METABOLIC PANEL - Abnormal; Notable for the following components:      Result Value   Total Protein 6.3 (*)    ALT 15 (*)    All other components within normal limits  CBC - Abnormal; Notable for the following components:   Platelets 144 (*)    All other components within normal limits  LIPASE, BLOOD  URINALYSIS, ROUTINE W REFLEX MICROSCOPIC     EKG None  Radiology No results found.  Procedures Procedures (including critical care time)  Medications Ordered in ED Medications  ondansetron (ZOFRAN) tablet 4 mg (4 mg Oral Given 02/17/18 1248)     Initial Impression / Assessment and Plan / ED Course  I have reviewed the triage vital signs and the nursing notes.  Pertinent labs & imaging results that were available during my care of the patient were reviewed by me and considered in my medical decision making (see chart for details).      Final Clinical Impressions(s) / ED Diagnoses   Final diagnoses:  Viral gastroenteritis    Labs: BC, CMP, lipase  Imaging:  Consults:  Therapeutics: Zofran  Discharge Meds: Zofran  Assessment/Plan: 52 year old male presents today with likely gastroenteritis.  He is very well-appearing in no acute distress.  Patient has reassuring laboratory analysis, reassuring vital signs with no fever or tachycardia.  Patient with my epigastric abdominal pain, symptoms improved with Zofran tolerating p.o. discharged with outpatient follow-up and strict return precautions      ED Discharge Orders        Ordered    ondansetron (ZOFRAN) 4 MG tablet  Every 6 hours     02/17/18 1333       Eyvonne Mechanic, PA-C 02/17/18 1631    Mancel Bale, MD 02/19/18 2029
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
## 2018-06-13 ENCOUNTER — Emergency Department (HOSPITAL_COMMUNITY)
Admission: EM | Admit: 2018-06-13 | Discharge: 2018-06-13 | Disposition: A | Discharge status: Home / Self Care | Payer: No Typology Code available for payment source | Attending: Emergency Medicine | Admitting: Emergency Medicine

## 2018-06-13 ENCOUNTER — Encounter (HOSPITAL_COMMUNITY): Payer: Self-pay

## 2018-06-13 DIAGNOSIS — Z5321 Procedure and treatment not carried out due to patient leaving prior to being seen by health care provider: Secondary | ICD-10-CM | POA: Insufficient documentation

## 2018-06-13 DIAGNOSIS — M25511 Pain in right shoulder: Secondary | ICD-10-CM | POA: Insufficient documentation

## 2018-06-13 DIAGNOSIS — M542 Cervicalgia: Secondary | ICD-10-CM | POA: Diagnosis not present
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
## 2018-06-15 ENCOUNTER — Encounter (HOSPITAL_COMMUNITY): Payer: Self-pay | Admitting: Emergency Medicine

## 2018-06-15 ENCOUNTER — Other Ambulatory Visit: Payer: Self-pay

## 2018-06-15 ENCOUNTER — Emergency Department (HOSPITAL_COMMUNITY)
Admission: EM | Admit: 2018-06-15 | Discharge: 2018-06-15 | Disposition: A | Discharge status: Home / Self Care | Payer: No Typology Code available for payment source | Attending: Emergency Medicine | Admitting: Emergency Medicine

## 2018-06-15 DIAGNOSIS — M542 Cervicalgia: Secondary | ICD-10-CM | POA: Insufficient documentation

## 2018-06-15 DIAGNOSIS — F17210 Nicotine dependence, cigarettes, uncomplicated: Secondary | ICD-10-CM | POA: Insufficient documentation

## 2018-06-15 DIAGNOSIS — Z79899 Other long term (current) drug therapy: Secondary | ICD-10-CM | POA: Insufficient documentation

## 2018-06-15 MED ORDER — CYCLOBENZAPRINE HCL 10 MG PO TABS: 10.0000 mg | ORAL_TABLET | Freq: Two times a day (BID) | ORAL | 0 refills | Status: DC | PRN
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
## 2018-06-15 NOTE — Discharge Instructions (Signed)
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
## 2019-01-11 ENCOUNTER — Emergency Department (HOSPITAL_COMMUNITY)
Admission: EM | Admit: 2019-01-11 | Discharge: 2019-01-12 | Disposition: A | Discharge status: Home / Self Care | Payer: Self-pay | Attending: Emergency Medicine | Admitting: Emergency Medicine

## 2019-01-11 ENCOUNTER — Other Ambulatory Visit: Payer: Self-pay

## 2019-01-11 ENCOUNTER — Encounter (HOSPITAL_COMMUNITY): Payer: Self-pay | Admitting: Emergency Medicine

## 2019-01-11 DIAGNOSIS — M542 Cervicalgia: Secondary | ICD-10-CM | POA: Insufficient documentation

## 2019-01-11 DIAGNOSIS — F17210 Nicotine dependence, cigarettes, uncomplicated: Secondary | ICD-10-CM | POA: Insufficient documentation

## 2019-01-11 DIAGNOSIS — Z79899 Other long term (current) drug therapy: Secondary | ICD-10-CM | POA: Insufficient documentation

## 2019-01-11 NOTE — ED Triage Notes (Signed)
C/o posterior neck pain x 2 weeks with pain and numbness to R arm.  No known injury.

## 2019-01-12 ENCOUNTER — Emergency Department (HOSPITAL_COMMUNITY): Payer: Self-pay

## 2019-01-12 MED ORDER — METHOCARBAMOL 500 MG PO TABS: 500.0000 mg | ORAL_TABLET | Freq: Two times a day (BID) | ORAL | 0 refills | Status: AC

## 2019-01-12 MED ORDER — DIAZEPAM 2 MG PO TABS
2.0000 mg | ORAL_TABLET | Freq: Once | ORAL | Status: DC
Start: 2019-01-12 — End: 2019-01-12

## 2019-01-12 NOTE — ED Notes (Signed)
Pt resting on stretcher with eyes closed, RR even and unlabored, NAD 
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
## 2019-01-12 NOTE — Discharge Instructions (Signed)
I have prescribed muscle relaxers for your pain, please do not drink or drive while taking this medications as they can make you drowsy.  I have provided a referral to a primary care please schedule an appointment for further management of your neck pain.
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
## 2019-01-22 ENCOUNTER — Ambulatory Visit: Payer: Self-pay | Attending: Family Medicine | Admitting: Physician Assistant

## 2019-01-22 VITALS — BP 126/75 | HR 84 | Temp 98.2°F | Ht 72.0 in | Wt 182.0 lb

## 2019-01-22 DIAGNOSIS — M542 Cervicalgia: Secondary | ICD-10-CM

## 2019-01-22 DIAGNOSIS — M62838 Other muscle spasm: Secondary | ICD-10-CM

## 2019-01-22 DIAGNOSIS — Z09 Encounter for follow-up examination after completed treatment for conditions other than malignant neoplasm: Secondary | ICD-10-CM

## 2019-01-22 MED ORDER — METHOCARBAMOL 500 MG PO TABS: 1000.0000 mg | ORAL_TABLET | Freq: Three times a day (TID) | ORAL | 1 refills | Status: AC

## 2019-01-22 MED ORDER — DICLOFENAC SODIUM 75 MG PO TBEC: 75.0000 mg | DELAYED_RELEASE_TABLET | Freq: Two times a day (BID) | ORAL | 1 refills | Status: DC

## 2019-01-29 ENCOUNTER — Other Ambulatory Visit: Payer: Self-pay

## 2019-01-29 ENCOUNTER — Ambulatory Visit: Payer: Self-pay | Attending: Family Medicine

## 2019-02-19 ENCOUNTER — Ambulatory Visit: Payer: Self-pay | Admitting: Family Medicine

## 2019-06-20 IMAGING — CR DG CERVICAL SPINE 2 OR 3 VIEWS
3 series · 3 of 3 positions shown · non-contrast
Comparison: 01/31/2015 CT head and cervical spine.

CLINICAL DATA: 52 y/o  M; neck pain.

EXAM:
CERVICAL SPINE - 2-3 VIEW

[c-spine lat]
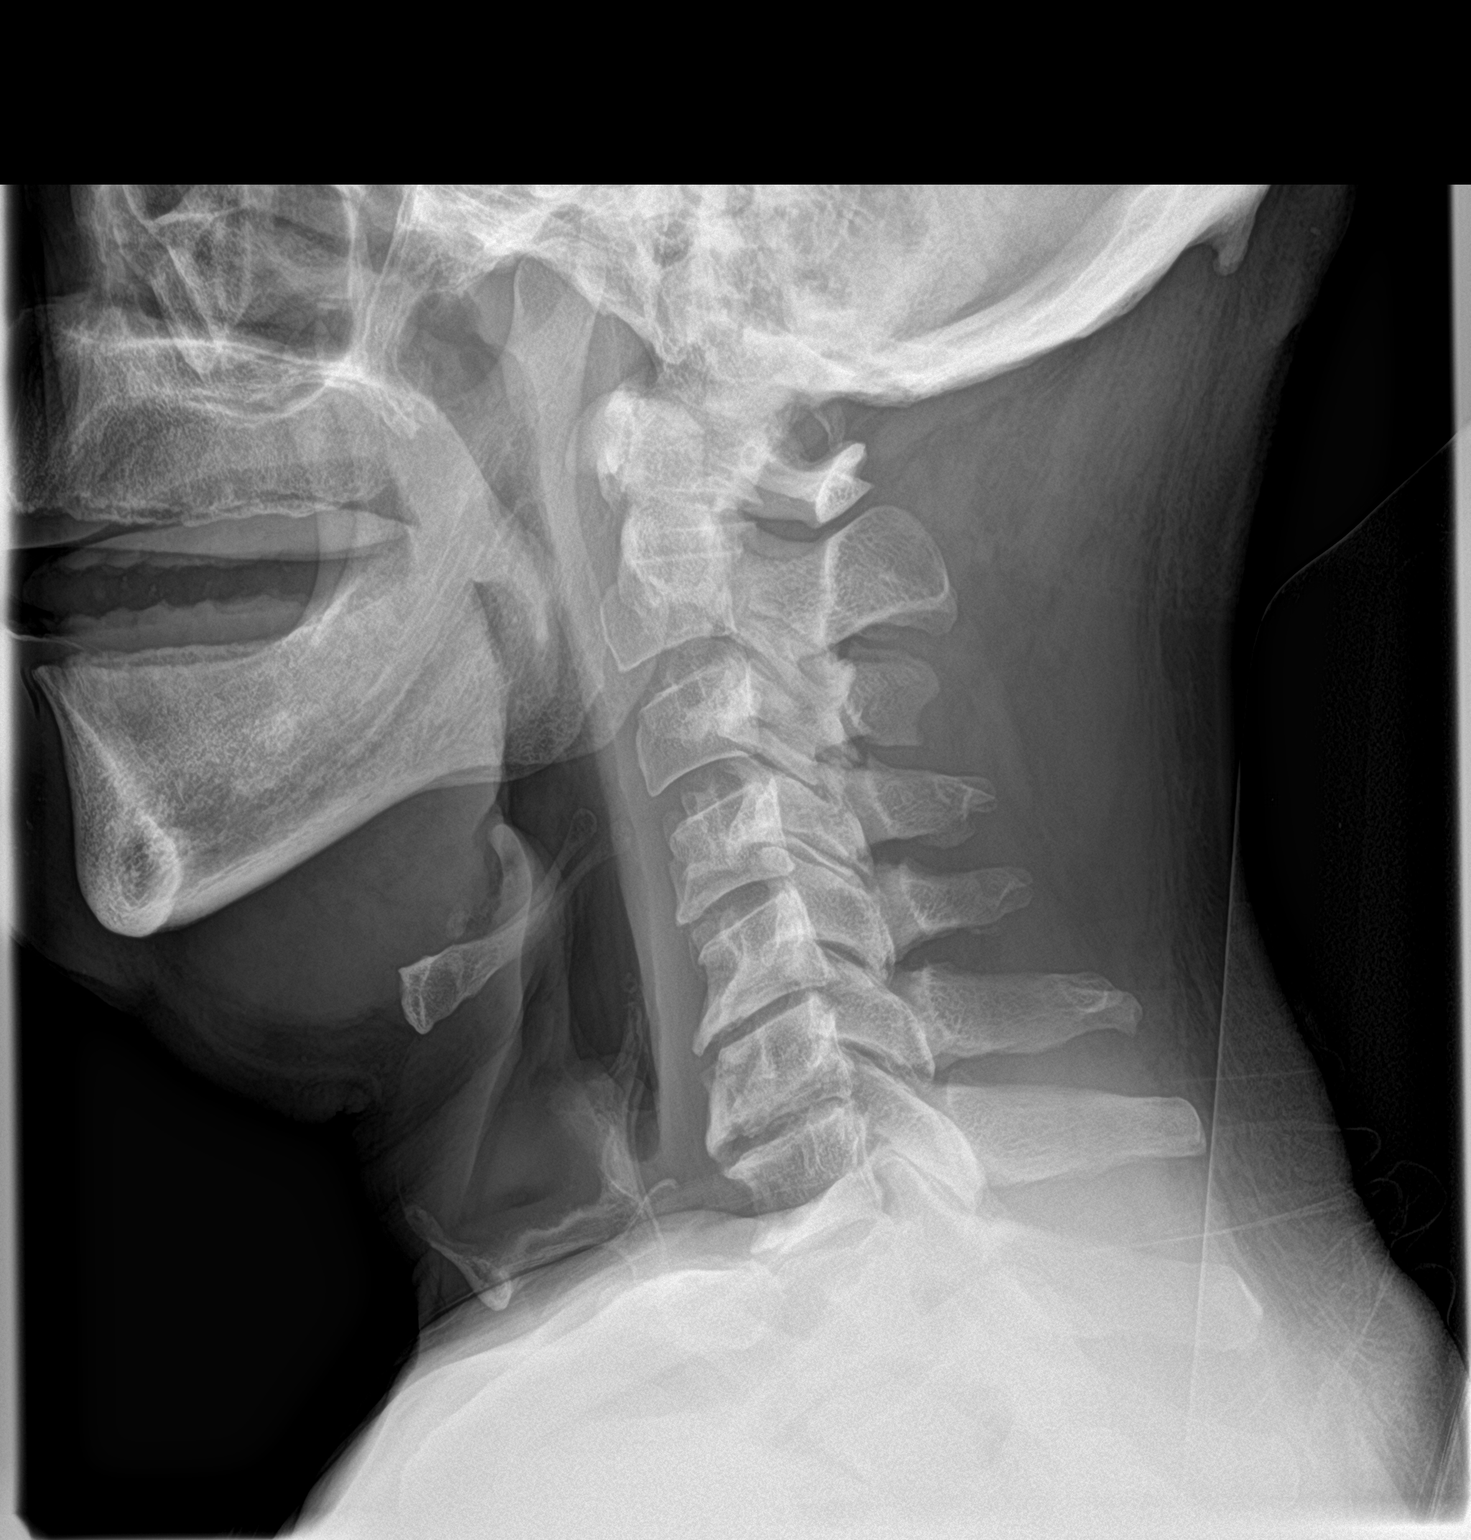

[c-spine ap]
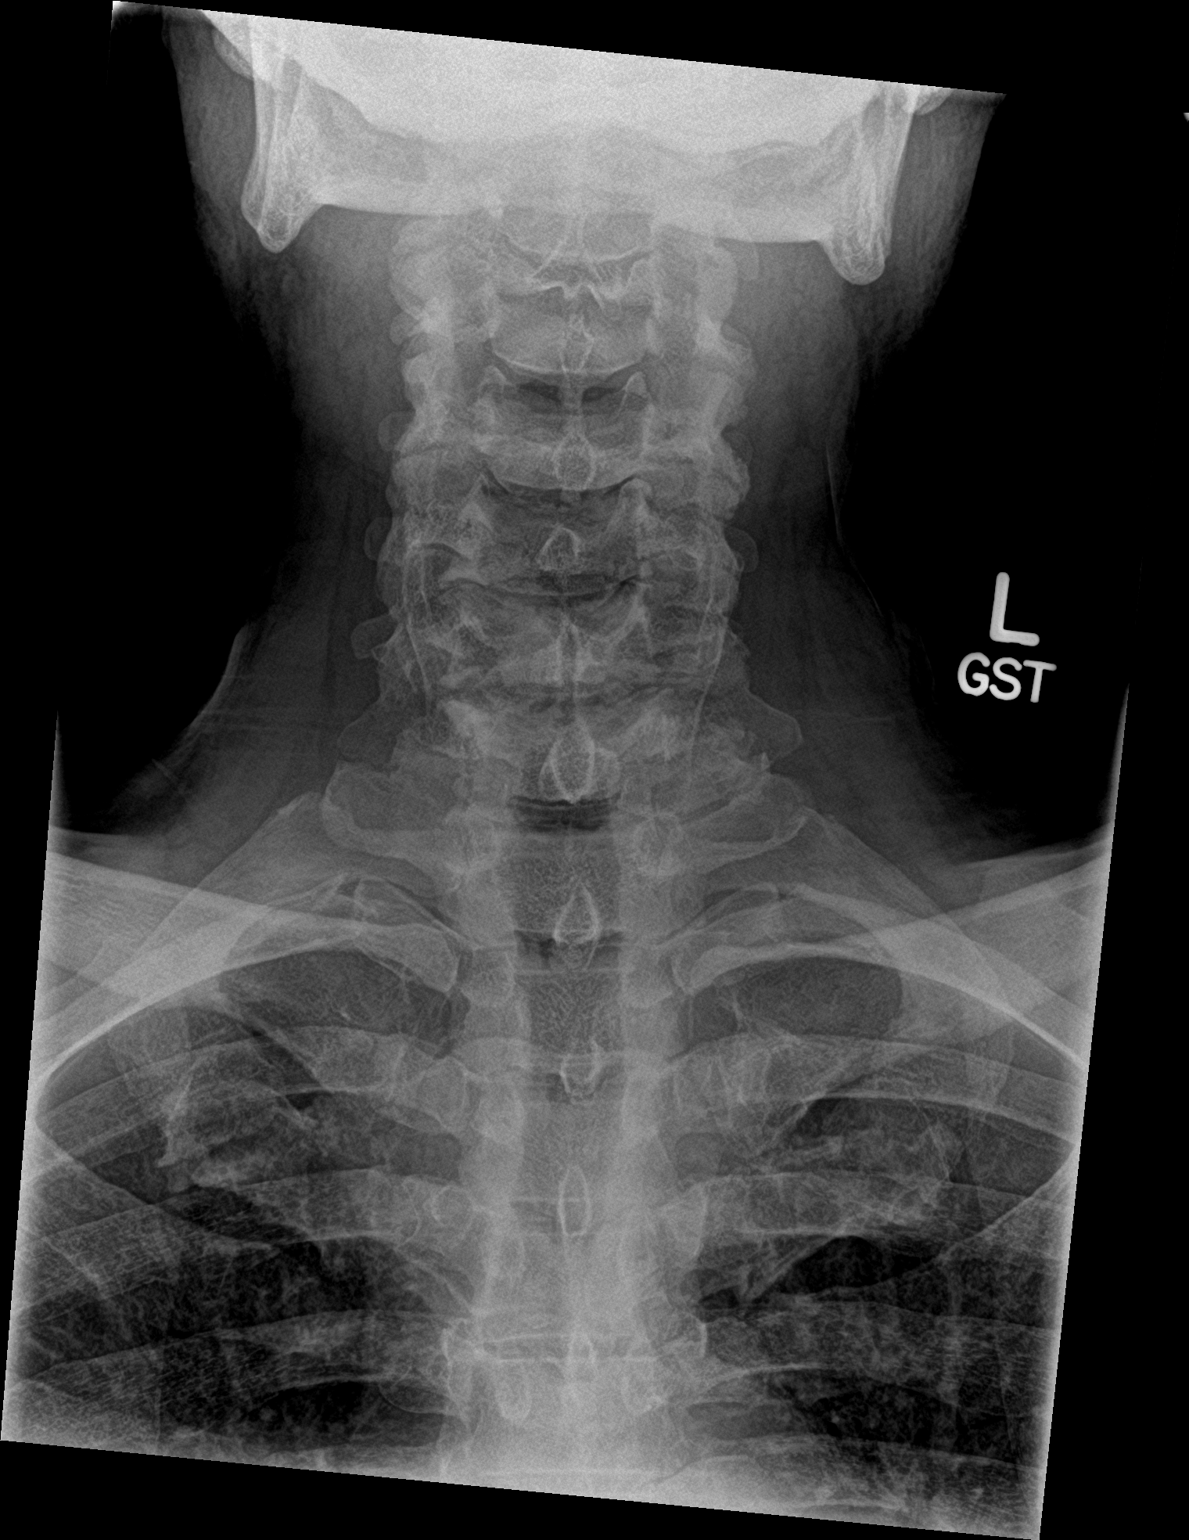

[c-spine open mouth]
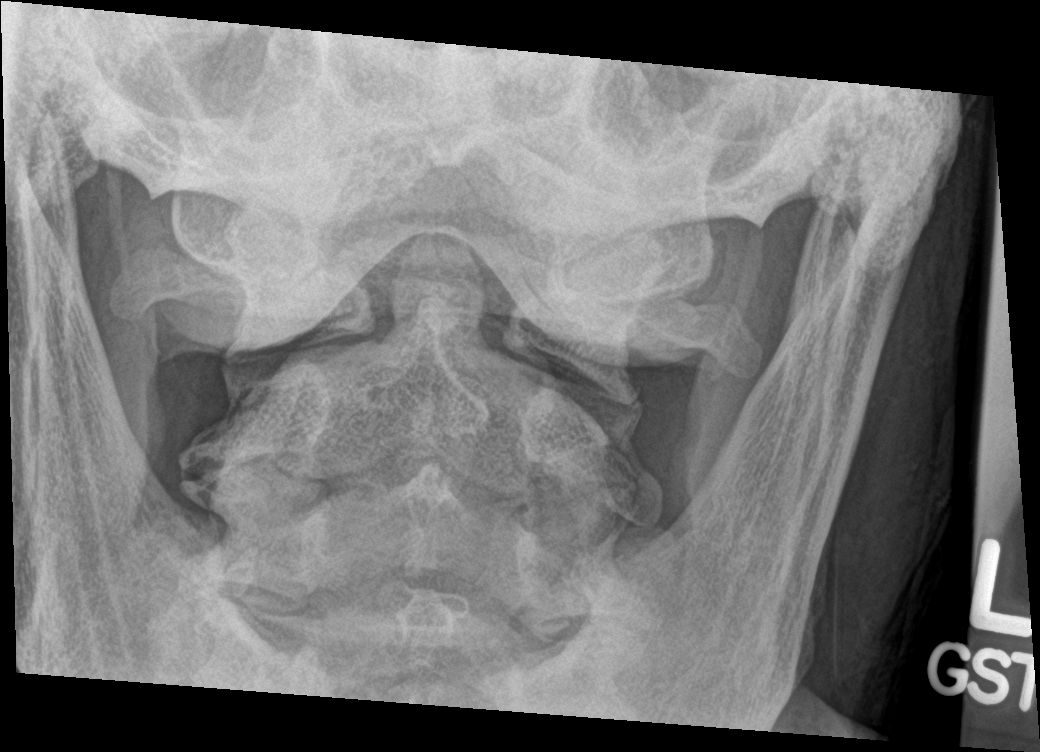

[3 of 3 positions shown; findings below may reference images not displayed]

FINDINGS: C1-T1 visible on the lateral view. Straightening of cervical
lordosis without listhesis. Stable discogenic degenerative changes
with loss of intervertebral disc space height and endplate marginal
osteophytosis at C5-C7. No loss of vertebral body height, fracture,
or dislocation. No prevertebral soft tissue thickening.
IMPRESSION: 1. No acute fracture or dislocation.
2. Stable cervical spondylosis greatest at C5-C7.

## 2021-10-02 ENCOUNTER — Encounter (HOSPITAL_COMMUNITY): Payer: Self-pay

## 2021-10-02 ENCOUNTER — Emergency Department (HOSPITAL_COMMUNITY)
Admission: EM | Admit: 2021-10-02 | Discharge: 2021-10-02 | Disposition: A | Discharge status: Home / Self Care | Payer: Self-pay | Attending: Emergency Medicine | Admitting: Emergency Medicine

## 2021-10-02 ENCOUNTER — Other Ambulatory Visit: Payer: Self-pay

## 2021-10-02 DIAGNOSIS — F17210 Nicotine dependence, cigarettes, uncomplicated: Secondary | ICD-10-CM | POA: Insufficient documentation

## 2021-10-02 DIAGNOSIS — J101 Influenza due to other identified influenza virus with other respiratory manifestations: Secondary | ICD-10-CM | POA: Insufficient documentation

## 2021-10-02 DIAGNOSIS — J111 Influenza due to unidentified influenza virus with other respiratory manifestations: Secondary | ICD-10-CM

## 2021-10-02 DIAGNOSIS — R531 Weakness: Secondary | ICD-10-CM | POA: Insufficient documentation

## 2021-10-02 DIAGNOSIS — Z20822 Contact with and (suspected) exposure to covid-19: Secondary | ICD-10-CM | POA: Insufficient documentation

## 2021-10-02 LAB — RESP PANEL BY RT-PCR (FLU A&B, COVID) ARPGX2
Influenza A by PCR: POSITIVE — AB
Influenza B by PCR: NEGATIVE
SARS Coronavirus 2 by RT PCR: NEGATIVE

## 2021-10-02 LAB — CBC WITH DIFFERENTIAL/PLATELET
Abs Immature Granulocytes: 0.05 10*3/uL (ref 0.00–0.07)
Abs Immature Granulocytes: 0.01 10*3/uL (ref 0.00–0.07)
Basophils Absolute: 0 10*3/uL (ref 0.0–0.1)
Basophils Absolute: 0 10*3/uL (ref 0.0–0.1)
Basophils Relative: 0 %
Basophils Relative: 1 %
Eosinophils Absolute: 0 10*3/uL (ref 0.0–0.5)
Eosinophils Absolute: 0 10*3/uL (ref 0.0–0.5)
Eosinophils Relative: 0 %
Eosinophils Relative: 0 %
HCT: 42.6 % (ref 39.0–52.0)
HCT: 53.7 % — ABNORMAL HIGH (ref 39.0–52.0)
Hemoglobin: 14.9 g/dL (ref 13.0–17.0)
Hemoglobin: 18 g/dL — ABNORMAL HIGH (ref 13.0–17.0)
Immature Granulocytes: 0 %
Immature Granulocytes: 0 %
Lymphocytes Relative: 5 %
Lymphocytes Relative: 26 %
Lymphs Abs: 0.6 10*3/uL — ABNORMAL LOW (ref 0.7–4.0)
Lymphs Abs: 0.9 10*3/uL (ref 0.7–4.0)
MCH: 30.9 pg (ref 26.0–34.0)
MCH: 30.9 pg (ref 26.0–34.0)
MCHC: 35 g/dL (ref 30.0–36.0)
MCHC: 33.5 g/dL (ref 30.0–36.0)
MCV: 88.4 fL (ref 80.0–100.0)
MCV: 92.1 fL (ref 80.0–100.0)
Monocytes Absolute: 0.8 10*3/uL (ref 0.1–1.0)
Monocytes Absolute: 0.8 10*3/uL (ref 0.1–1.0)
Monocytes Relative: 6 %
Monocytes Relative: 22 %
Neutro Abs: 11.5 10*3/uL — ABNORMAL HIGH (ref 1.7–7.7)
Neutro Abs: 1.8 10*3/uL (ref 1.7–7.7)
Neutrophils Relative %: 89 %
Neutrophils Relative %: 51 %
Platelets: 407 10*3/uL — ABNORMAL HIGH (ref 150–400)
Platelets: 131 10*3/uL — ABNORMAL LOW (ref 150–400)
RBC: 4.82 MIL/uL (ref 4.22–5.81)
RBC: 5.83 MIL/uL — ABNORMAL HIGH (ref 4.22–5.81)
RDW: 12.3 % (ref 11.5–15.5)
RDW: 13.6 % (ref 11.5–15.5)
WBC: 12.9 10*3/uL — ABNORMAL HIGH (ref 4.0–10.5)
WBC: 3.5 10*3/uL — ABNORMAL LOW (ref 4.0–10.5)
nRBC: 0 % (ref 0.0–0.2)
nRBC: 0 % (ref 0.0–0.2)

## 2021-10-02 LAB — BASIC METABOLIC PANEL
Anion gap: 11 (ref 5–15)
BUN: 9 mg/dL (ref 6–20)
CO2: 25 mmol/L (ref 22–32)
Calcium: 9 mg/dL (ref 8.9–10.3)
Chloride: 96 mmol/L — ABNORMAL LOW (ref 98–111)
Creatinine, Ser: 1.06 mg/dL (ref 0.61–1.24)
GFR, Estimated: >60 mL/min (ref >60)
Glucose, Bld: 98 mg/dL (ref 70–99)
Potassium: 3.5 mmol/L (ref 3.5–5.1)
Sodium: 132 mmol/L — ABNORMAL LOW (ref 135–145)

## 2021-10-02 MED ORDER — ONDANSETRON 4 MG PO TBDP: 4.0000 mg | ORAL_TABLET | Freq: Three times a day (TID) | ORAL | 0 refills | Status: DC | PRN

## 2021-10-02 MED ORDER — ONDANSETRON 4 MG PO TBDP
4.0000 mg | ORAL_TABLET | Freq: Three times a day (TID) | ORAL | 0 refills | Status: DC | PRN
  Filled 2021-10-02: qty 20, 7d supply, fill #0

## 2021-10-02 NOTE — ED Notes (Signed)
Discharge instructions reviewed with patient . Patient verbalized understanding of instructions. Follow-up care was reviewed. Patient ambulatory with steady gait. VSS upon discharge.  

## 2021-10-02 NOTE — ED Provider Notes (Signed)
Emergency Medicine Provider Triage Evaluation Note  KEIR VIERNES , a 55 y.o. male  was evaluated in triage.  Pt complains of "flu-like" symptoms. Initially developed vomiting on Thursday which has now improved x 48 hours. Continues to have congestion, cough, nausea. Using Vick's w/o relief. No known fevers. Believes he may have gotten sick from his daughter.  Review of Systems  Positive: Cough, congestion, vomiting Negative: As above  Physical Exam  BP 123/87 (BP Location: Right Arm)   Pulse 93   Temp 99 F (37.2 C)   Resp 17   SpO2 98%  Gen:   Awake, no distress   Resp:  Normal effort  MSK:   Moves extremities without difficulty  Other:  Lungs CTAB  Medical Decision Making  Medically screening exam initiated at 5:06 AM.  Appropriate orders placed.  OTT ZIMMERLE was informed that the remainder of the evaluation will be completed by another provider, this initial triage assessment does not replace that evaluation, and the importance of remaining in the ED until their evaluation is complete.  Flu-like symptoms   Antony Madura, Cordelia Poche 10/02/21 8315    Dione Booze, MD 10/02/21 [phone]

## 2021-10-02 NOTE — ED Notes (Signed)
CBC that resulted at 528 does not belong to this pt, lab called and told to stop running BMP, labs reordered

## 2021-10-02 NOTE — ED Provider Notes (Signed)
[facility] EMERGENCY DEPARTMENT Provider Note   CSN: [id number] Arrival date & time: 10/02/21  0449     History Chief Complaint  Patient presents with   Influenza    Douglas Schroeder is a 55 y.o. male.  HPI Patient presents with 4 days of nausea, weakness, fatigue.  No focal pain.  He notes that his daughter, 95 years old, had similar illness last week.  Since that time he has had worsening symptoms in spite of using multiple OTC medication.  No fever, no vomiting, no diarrhea.  Patient does smoke cigarettes, states that he will slow down.     History reviewed. No pertinent past medical history.  There are no problems to display for this patient.   Past Surgical History:  Procedure Laterality Date   OTHER SURGICAL HISTORY  Facial       No family history on file.  Social History   Tobacco Use   Smoking status: Every Day    Packs/day: 1.00    Types: Cigarettes   Smokeless tobacco: Never  Substance Use Topics   Alcohol use: Yes    Comment: occ   Drug use: No    Home Medications Prior to Admission medications   Medication Sig Start Date End Date Taking? Authorizing Provider  ondansetron (ZOFRAN ODT) 4 MG disintegrating tablet Take 1 tablet (4 mg total) by mouth every 8 (eight) hours as needed for nausea or vomiting. 10/02/21  Yes Gerhard Munch, MD  diclofenac (VOLTAREN) 75 MG EC tablet Take 1 tablet (75 mg total) by mouth 2 (two) times daily. X 10 days then prn pain 01/22/19   Georgian Co M, PA-C  methocarbamol (ROBAXIN) 500 MG tablet Take 2 tablets (1,000 mg total) by mouth 3 (three) times daily. X 10 days then prn muscle spasm 01/22/19   Georgian Co M, PA-C    Allergies    Citric acid  Review of Systems   Review of Systems  Constitutional:        Per HPI, otherwise negative  HENT:         Per HPI, otherwise negative  Respiratory:         Per HPI, otherwise negative  Cardiovascular:        Per HPI, otherwise negative   Gastrointestinal:  Negative for vomiting.  Endocrine:       Negative aside from HPI  Genitourinary:        Neg aside from HPI   Musculoskeletal:        Per HPI, otherwise negative  Skin: Negative.   Allergic/Immunologic: Negative for immunocompromised state.  Neurological:  Negative for syncope.   Physical Exam Updated Vital Signs BP 127/82 (BP Location: Left Arm)   Pulse 100   Temp 98.4 F (36.9 C) (Oral)   Resp 18   SpO2 100%   Physical Exam Vitals and nursing note reviewed.  Constitutional:      General: He is not in acute distress.    Appearance: He is well-developed.  HENT:     Head: Normocephalic and atraumatic.  Eyes:     Conjunctiva/sclera: Conjunctivae normal.  Cardiovascular:     Rate and Rhythm: Normal rate and regular rhythm.  Pulmonary:     Effort: Pulmonary effort is normal. No respiratory distress.     Breath sounds: No stridor.  Abdominal:     General: There is no distension.  Skin:    General: Skin is warm and dry.  Neurological:     Mental Status:  He is alert and oriented to person, place, and time.    ED Results / Procedures / Treatments   Labs (all labs ordered are listed, but only abnormal results are displayed) Labs Reviewed  RESP PANEL BY RT-PCR (FLU A&B, COVID) ARPGX2 - Abnormal; Notable for the following components:      Result Value   Influenza A by PCR POSITIVE (*)    All other components within normal limits  CBC WITH DIFFERENTIAL/PLATELET - Abnormal; Notable for the following components:   WBC 12.9 (*)    Platelets 407 (*)    Neutro Abs 11.5 (*)    Lymphs Abs 0.6 (*)    All other components within normal limits  BASIC METABOLIC PANEL - Abnormal; Notable for the following components:   Sodium 132 (*)    Chloride 96 (*)    All other components within normal limits  CBC WITH DIFFERENTIAL/PLATELET - Abnormal; Notable for the following components:   WBC 3.5 (*)    RBC 5.83 (*)    Hemoglobin 18.0 (*)    HCT 53.7 (*)     Platelets 131 (*)    All other components within normal limits    EKG None  Radiology No results found.  Procedures Procedures   Medications Ordered in ED Medications - No data to display  ED Course  I have reviewed the triage vital signs and the nursing notes.  Pertinent labs & imaging results that were available during my care of the patient were reviewed by me and considered in my medical decision making (see chart for details).  Clinical Course as of 10/02/21 1156  Mon Oct 02, 2021  0547 CBC that resulted was missed labeled.  These results belong to a different patient.  Disregard CBC from 0514.  This test has been reordered.  RN to complete safety zone portal. [KH]    Clinical Course User Index [KH] Antony Madura, PA-C   MDM Rules/Calculators/A&P Patient awake, alert, in no distress, hemodynamically unremarkable presents with 4 days of flulike illness and is found to have influenza A on lab testing here.  Labs are generally reassuring, consistent with mild dehydration and infection.  No evidence for bacteremia, sepsis.  No hemodynamic instability, no indication for admission.  We discussed duration of illness, patient started on appropriate antiemetics, provided a work note, discharged in stable condition.  MDM Number of Diagnoses or Management Options Influenza: new, needed workup   Amount and/or Complexity of Data Reviewed Clinical lab tests: ordered and reviewed Tests in the medicine section of CPT: reviewed and ordered Decide to obtain previous medical records or to obtain history from someone other than the patient: yes Review and summarize past medical records: yes Independent visualization of images, tracings, or specimens: yes  Risk of Complications, Morbidity, and/or Mortality Presenting problems: high Diagnostic procedures: high Management options: high  Critical Care Total time providing critical care: < 30 minutes  Patient Progress Patient progress:  stable   Final Clinical Impression(s) / ED Diagnoses Final diagnoses:  Influenza    Rx / DC Orders ED Discharge Orders          Ordered    ondansetron (ZOFRAN ODT) 4 MG disintegrating tablet  Every 8 hours PRN        10/02/21 1155             Gerhard Munch, MD 10/02/21 1206

## 2021-10-02 NOTE — ED Triage Notes (Signed)
Pt reports that he has been having flu like symptoms for the past few days and abd pain

## 2021-10-02 NOTE — Discharge Instructions (Addendum)
As discussed, recovering from the flu can take up to 2 weeks.  Continue to stay well-hydrated, get plenty of rest, and use over-the-counter medicine such as Tylenol cough and cold.  In addition, please obtain and use your prescribed medication, Zofran for additional nausea relief.  Return here for concerning changes in your condition.

## 2023-08-27 ENCOUNTER — Ambulatory Visit (INDEPENDENT_AMBULATORY_CARE_PROVIDER_SITE_OTHER): Payer: Self-pay

## 2023-08-27 ENCOUNTER — Encounter (HOSPITAL_COMMUNITY): Payer: Self-pay

## 2023-08-27 ENCOUNTER — Ambulatory Visit (HOSPITAL_COMMUNITY)
Admission: EM | Admit: 2023-08-27 | Discharge: 2023-08-27 | Disposition: A | Discharge status: Home / Self Care | Payer: Self-pay | Attending: Internal Medicine | Admitting: Internal Medicine

## 2023-08-27 DIAGNOSIS — S43402A Unspecified sprain of left shoulder joint, initial encounter: Secondary | ICD-10-CM

## 2023-08-27 MED ORDER — NAPROXEN 500 MG PO TABS
500.0000 mg | ORAL_TABLET | Freq: Two times a day (BID) | ORAL | 0 refills | Status: AC
Start: 2023-08-27 — End: 2023-09-03

## 2023-08-27 NOTE — Discharge Instructions (Addendum)
The x-ray reading we discussed is preliminary. Your x-ray will be read by a radiologist in next few hours. If there is a discrepancy, you will be contacted, and instructed on a new plan for you care.   Wear the sling for comfort.  Take the medications for pain.  Call today to schedule an appointment with orthopedist for further evaluation
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
## 2023-09-06 ENCOUNTER — Ambulatory Visit (HOSPITAL_COMMUNITY)
Admission: EM | Admit: 2023-09-06 | Discharge: 2023-09-06 | Disposition: A | Discharge status: Home / Self Care | Payer: Self-pay | Attending: Internal Medicine | Admitting: Internal Medicine

## 2023-09-06 ENCOUNTER — Encounter (HOSPITAL_COMMUNITY): Payer: Self-pay

## 2023-09-06 DIAGNOSIS — S46812A Strain of other muscles, fascia and tendons at shoulder and upper arm level, left arm, initial encounter: Secondary | ICD-10-CM

## 2023-09-06 DIAGNOSIS — S46912D Strain of unspecified muscle, fascia and tendon at shoulder and upper arm level, left arm, subsequent encounter: Secondary | ICD-10-CM
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
## 2023-09-06 NOTE — Discharge Instructions (Addendum)
Call today to schedule with orthopedist for further evaluation and treatment Over-the-counter Aleve 2 tablets every 12 hours with food as needed for pain

## 2024-08-03 ENCOUNTER — Encounter: Payer: Self-pay | Admitting: Podiatry

## 2024-08-03 ENCOUNTER — Ambulatory Visit (INDEPENDENT_AMBULATORY_CARE_PROVIDER_SITE_OTHER): Admitting: Podiatry

## 2024-08-03 DIAGNOSIS — D492 Neoplasm of unspecified behavior of bone, soft tissue, and skin: Secondary | ICD-10-CM

## 2024-08-03 NOTE — Progress Notes (Signed)
  Subjective:  Patient ID: Douglas Schroeder, male    DOB: May 03, 1966,   MRN: [id number]  Chief Complaint  Patient presents with   Callouses    These calluses got me walking like a penguin.  They hurt.  I try to shave them but I can't get down to the core.    58 y.o. male presents for concern as above. Relates they have been affecting his daily life . Denies any other pedal complaints. Denies n/v/f/c.   No past medical history on file.  Objective:  Physical Exam: Vascular: DP/PT pulses 2/4 bilateral. CFT <3 seconds. Normal hair growth on digits. No edema.  Skin. No lacerations or abrasions bilateral feet. Hyperkeratotic cored lesion noted sub fifth metatarsal bilateral and sub first metatarsal head bilateral.  Musculoskeletal: MMT 5/5 bilateral lower extremities in DF, PF, Inversion and Eversion. Deceased ROM in DF of ankle joint.  Neurological: Sensation intact to light touch.   Assessment:   1. Skin neoplasm      Plan:  Patient was evaluated and treated and all questions answered. -Discussed benign skin lesions with patient and treatment options.  -Hyperkeratotic tissue was debrided with chisel without incident.  -Applied salycylic acid treatment to area with dressing. Advised to remove bandaging tomorrow.  -Encouraged daily moisturizing -Discussed use of pumice stone -Advised good supportive shoes and inserts -Patient to return to office as needed or sooner if condition worsens.   Asberry Failing, DPM

## 2024-08-03 NOTE — Progress Notes (Signed)
 t
